# Patient Record
Sex: Female | Born: 1970 | Race: Black or African American | Hispanic: No | Marital: Married | Smoking: Never smoker
Health system: Southern US, Community
[De-identification: ages and names within clinical notes are randomized; demographics above are authoritative.]

## PROBLEM LIST (undated history)

## (undated) DIAGNOSIS — E119 Type 2 diabetes mellitus without complications: Secondary | ICD-10-CM

## (undated) DIAGNOSIS — I10 Essential (primary) hypertension: Secondary | ICD-10-CM

## (undated) HISTORY — PX: HERNIA REPAIR: SHX51

## (undated) HISTORY — PX: CHOLECYSTECTOMY: SHX55

## (undated) HISTORY — PX: THYROID SURGERY: SHX805

## (undated) HISTORY — PX: TUBAL LIGATION: SHX77

---

## 2015-12-26 ENCOUNTER — Encounter (HOSPITAL_BASED_OUTPATIENT_CLINIC_OR_DEPARTMENT_OTHER): Payer: Self-pay

## 2015-12-26 ENCOUNTER — Emergency Department (HOSPITAL_BASED_OUTPATIENT_CLINIC_OR_DEPARTMENT_OTHER)
Admission: EM | Admit: 2015-12-26 | Discharge: 2015-12-27 | Disposition: A | Discharge status: Home / Self Care | Payer: Worker's Compensation | Attending: Emergency Medicine | Admitting: Emergency Medicine

## 2015-12-26 ENCOUNTER — Emergency Department (HOSPITAL_BASED_OUTPATIENT_CLINIC_OR_DEPARTMENT_OTHER): Payer: Worker's Compensation

## 2015-12-26 DIAGNOSIS — Z79899 Other long term (current) drug therapy: Secondary | ICD-10-CM | POA: Diagnosis not present

## 2015-12-26 DIAGNOSIS — I10 Essential (primary) hypertension: Secondary | ICD-10-CM | POA: Diagnosis not present

## 2015-12-26 DIAGNOSIS — M545 Low back pain: Secondary | ICD-10-CM | POA: Diagnosis present

## 2015-12-26 DIAGNOSIS — E119 Type 2 diabetes mellitus without complications: Secondary | ICD-10-CM | POA: Insufficient documentation

## 2015-12-26 DIAGNOSIS — M5432 Sciatica, left side: Secondary | ICD-10-CM | POA: Diagnosis not present

## 2015-12-26 HISTORY — DX: Type 2 diabetes mellitus without complications: E11.9

## 2015-12-26 HISTORY — DX: Essential (primary) hypertension: I10

## 2015-12-26 LAB — PREGNANCY, URINE: Preg Test, Ur: NEGATIVE
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
## 2015-12-26 MED ORDER — KETOROLAC TROMETHAMINE 60 MG/2ML IM SOLN
60.0000 mg | Freq: Once | INTRAMUSCULAR | Status: DC
Start: 2015-12-26 — End: 2015-12-26

## 2015-12-26 MED ORDER — DEXAMETHASONE SODIUM PHOSPHATE 10 MG/ML IJ SOLN
10.0000 mg | Freq: Once | INTRAMUSCULAR | Status: AC
  Administered 2015-12-26: 10 mg via INTRAMUSCULAR
  Filled 2015-12-26: qty 1

## 2015-12-26 MED ORDER — IBUPROFEN 800 MG PO TABS
800.0000 mg | ORAL_TABLET | Freq: Once | ORAL | Status: AC
  Administered 2015-12-26: 800 mg via ORAL
  Filled 2015-12-26: qty 1

## 2015-12-26 MED ORDER — METHOCARBAMOL 500 MG PO TABS
1000.0000 mg | ORAL_TABLET | Freq: Once | ORAL | Status: AC
  Administered 2015-12-26: 1000 mg via ORAL
  Filled 2015-12-26: qty 2
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
## 2015-12-26 NOTE — ED Notes (Signed)
Pt placed on auto vitals Q30.  
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
## 2015-12-27 ENCOUNTER — Encounter (HOSPITAL_BASED_OUTPATIENT_CLINIC_OR_DEPARTMENT_OTHER): Payer: Self-pay | Admitting: Emergency Medicine

## 2015-12-27 MED ORDER — METHOCARBAMOL 500 MG PO TABS: 500.0000 mg | ORAL_TABLET | Freq: Two times a day (BID) | ORAL | Status: AC

## 2015-12-27 MED ORDER — MELOXICAM 15 MG PO TABS: 15.0000 mg | ORAL_TABLET | Freq: Every day | ORAL | Status: AC

## 2015-12-27 MED ORDER — PREDNISONE 20 MG PO TABS: ORAL_TABLET | ORAL | Status: AC

## 2015-12-27 NOTE — Discharge Instructions (Signed)

## 2015-12-27 NOTE — ED Notes (Signed)
Pt verbalizes understanding of d/c instructions and denies any further needs at this time. 

## 2021-02-01 ENCOUNTER — Emergency Department (HOSPITAL_BASED_OUTPATIENT_CLINIC_OR_DEPARTMENT_OTHER)
Admission: EM | Admit: 2021-02-01 | Discharge: 2021-02-01 | Disposition: A | Discharge status: Home / Self Care | Payer: BC Managed Care – PPO | Attending: Emergency Medicine | Admitting: Emergency Medicine

## 2021-02-01 ENCOUNTER — Encounter (HOSPITAL_BASED_OUTPATIENT_CLINIC_OR_DEPARTMENT_OTHER): Payer: Self-pay | Admitting: *Deleted

## 2021-02-01 ENCOUNTER — Emergency Department (HOSPITAL_BASED_OUTPATIENT_CLINIC_OR_DEPARTMENT_OTHER): Payer: BC Managed Care – PPO

## 2021-02-01 ENCOUNTER — Other Ambulatory Visit: Payer: Self-pay

## 2021-02-01 DIAGNOSIS — M545 Low back pain, unspecified: Secondary | ICD-10-CM | POA: Diagnosis not present

## 2021-02-01 DIAGNOSIS — E119 Type 2 diabetes mellitus without complications: Secondary | ICD-10-CM | POA: Insufficient documentation

## 2021-02-01 DIAGNOSIS — M25512 Pain in left shoulder: Secondary | ICD-10-CM | POA: Diagnosis not present

## 2021-02-01 DIAGNOSIS — I10 Essential (primary) hypertension: Secondary | ICD-10-CM | POA: Diagnosis not present

## 2021-02-01 DIAGNOSIS — Z79899 Other long term (current) drug therapy: Secondary | ICD-10-CM | POA: Insufficient documentation

## 2021-02-01 DIAGNOSIS — Z8616 Personal history of COVID-19: Secondary | ICD-10-CM | POA: Diagnosis not present
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
## 2021-02-07 ENCOUNTER — Ambulatory Visit: Payer: Self-pay

## 2021-02-07 ENCOUNTER — Encounter: Payer: Self-pay | Admitting: Family Medicine

## 2021-02-07 ENCOUNTER — Ambulatory Visit (INDEPENDENT_AMBULATORY_CARE_PROVIDER_SITE_OTHER): Payer: BC Managed Care – PPO | Admitting: Family Medicine

## 2021-02-07 ENCOUNTER — Other Ambulatory Visit: Payer: Self-pay

## 2021-02-07 VITALS — BP 170/106 | Ht 64.25 in | Wt 208.0 lb

## 2021-02-07 DIAGNOSIS — M25512 Pain in left shoulder: Secondary | ICD-10-CM

## 2021-02-07 DIAGNOSIS — M778 Other enthesopathies, not elsewhere classified: Secondary | ICD-10-CM | POA: Diagnosis not present

## 2021-02-07 NOTE — Patient Instructions (Signed)
Nice to meet you Please try ice  Please try the exercises  Please take 2 pills of rayos as close to 10 pm. Please take until it is finished. This is a steroid so it can raise your blood sugar.   Please send me a message in MyChart with any questions or updates.  Please see me back in 4 weeks.   --Dr. Jordan Likes
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
## 2021-02-07 NOTE — Progress Notes (Signed)
Medication Samples have been provided to the patient.  Drug name:    rayose    Strength:  5mg        Qty: 2 boxes  LOT:  Exp.Date: 7/22  Dosing instructions: take 2 at 10pm  The patient has been instructed regarding the correct time, dose, and frequency of taking this medication, including desired effects and most common side effects.   8/22 4:23 PM 02/07/2021

## 2021-02-08 DIAGNOSIS — M778 Other enthesopathies, not elsewhere classified: Secondary | ICD-10-CM | POA: Insufficient documentation
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
## 2021-03-01 ENCOUNTER — Telehealth: Payer: Self-pay | Admitting: Family Medicine

## 2021-03-01 NOTE — Telephone Encounter (Signed)
Patient called state forgot to get work restriction  note for 2nd job she works during summer only( ask if provider can write a note limiting weight / lbs she can lift .  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  Thanks  glh

## 2021-03-02 NOTE — Telephone Encounter (Signed)
I called pt- she requests letter be emailed to her and left upfront at our front desk for her to p/u next week.   Letter emailed to [email or web address]. Original upfront for p/u.

## 2021-03-02 NOTE — Telephone Encounter (Signed)
Provided work note.   Myra Rude, MD [facility] 03/02/2021, 1:53 PM

## 2021-03-06 ENCOUNTER — Other Ambulatory Visit: Payer: Self-pay

## 2021-03-06 ENCOUNTER — Encounter: Payer: Self-pay | Admitting: Family Medicine

## 2021-03-06 ENCOUNTER — Ambulatory Visit (INDEPENDENT_AMBULATORY_CARE_PROVIDER_SITE_OTHER): Payer: BC Managed Care – PPO | Admitting: Family Medicine

## 2021-03-06 VITALS — BP 148/90 | Ht 64.25 in | Wt 208.0 lb

## 2021-03-06 DIAGNOSIS — M778 Other enthesopathies, not elsewhere classified: Secondary | ICD-10-CM | POA: Diagnosis not present
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
## 2021-03-06 NOTE — Patient Instructions (Signed)
Good to see you Please try ice as needed  Please try physical therapy   Please send me a message in MyChart with any questions or updates.  Please see me back in 4 weeks.   --Dr. Jordan Likes

## 2021-03-15 ENCOUNTER — Ambulatory Visit: Payer: BC Managed Care – PPO | Admitting: Rehabilitative and Restorative Service Providers"

## 2021-03-16 ENCOUNTER — Other Ambulatory Visit: Payer: Self-pay

## 2021-03-16 ENCOUNTER — Encounter: Payer: Self-pay | Admitting: Physical Therapy

## 2021-03-16 ENCOUNTER — Ambulatory Visit (INDEPENDENT_AMBULATORY_CARE_PROVIDER_SITE_OTHER): Payer: BC Managed Care – PPO | Admitting: Physical Therapy

## 2021-03-16 DIAGNOSIS — M25512 Pain in left shoulder: Secondary | ICD-10-CM | POA: Diagnosis not present

## 2021-03-16 DIAGNOSIS — R29898 Other symptoms and signs involving the musculoskeletal system: Secondary | ICD-10-CM

## 2021-03-16 DIAGNOSIS — M6281 Muscle weakness (generalized): Secondary | ICD-10-CM
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
## 2021-03-16 NOTE — Patient Instructions (Signed)
Access Code: [email or web address] URL: [email or web address] Date: 03/16/2021 Prepared by: Reggy Eye  Exercises Supine Shoulder Flexion Extension AAROM with Dowel - 1 x daily - 7 x weekly - 2 sets - 10 reps Supine Shoulder Abduction AAROM with Dowel - 1 x daily - 7 x weekly - 2 sets - 10 reps Standing Bilateral Low Shoulder Row with Anchored Resistance - 1 x daily - 7 x weekly - 3 sets - 10 reps Shoulder extension with resistance - Neutral - 1 x daily - 7 x weekly - 3 sets - 10 reps Standing Shoulder Extension with Dowel - 1 x daily - 7 x weekly - 2 sets - 10 reps

## 2021-03-23 ENCOUNTER — Ambulatory Visit: Payer: BC Managed Care – PPO | Admitting: Physical Therapy

## 2021-03-23 ENCOUNTER — Other Ambulatory Visit: Payer: Self-pay

## 2021-03-23 DIAGNOSIS — R29898 Other symptoms and signs involving the musculoskeletal system: Secondary | ICD-10-CM

## 2021-03-23 DIAGNOSIS — M6281 Muscle weakness (generalized): Secondary | ICD-10-CM

## 2021-03-23 DIAGNOSIS — M25512 Pain in left shoulder: Secondary | ICD-10-CM | POA: Diagnosis not present

## 2021-03-23 NOTE — Patient Instructions (Signed)
Access Code: [email or web address] URL: [email or web address] Date: 03/23/2021 Prepared by: Reggy Eye  Exercises Supine Shoulder Flexion Extension AAROM with Dowel - 1 x daily - 7 x weekly - 2 sets - 10 reps Supine Shoulder Abduction AAROM with Dowel - 1 x daily - 7 x weekly - 2 sets - 10 reps Standing Bilateral Low Shoulder Row with Anchored Resistance - 1 x daily - 7 x weekly - 3 sets - 10 reps Shoulder extension with resistance - Neutral - 1 x daily - 7 x weekly - 3 sets - 10 reps Standing Shoulder Extension with Dowel - 1 x daily - 7 x weekly - 2 sets - 10 reps Shoulder External Rotation and Scapular Retraction with Resistance - 1 x daily - 7 x weekly - 3 sets - 10 reps Seated Shoulder Diagonal with Resistance - 1 x daily - 7 x weekly - 3 sets - 10 reps  Patient Education [place] Patient Instructions

## 2021-03-23 NOTE — Therapy (Signed)
[facility] [place] [address] [place], [place], [address] Phone: [phone]   Fax:  [phone]  Physical Therapy Treatment  Patient Details  Name: Morgan Frey MRN: [id number] Date of Birth: 06/07/1971 Referring Provider (PT): schmitz, jeremy   Encounter Date: 03/23/2021   PT End of Session - 03/23/21 1102     Visit Number 2    Number of Visits 6    Date for PT Re-Evaluation 04/27/21    PT Start Time 1020    PT Stop Time 1100    PT Time Calculation (min) 40 min    Activity Tolerance Patient tolerated treatment well    Behavior During Therapy [facility] for tasks assessed/performed             Past Medical History:  Diagnosis Date   Diabetes mellitus without complication (HCC)    Hypertension     Past Surgical History:  Procedure Laterality Date   CHOLECYSTECTOMY     HERNIA REPAIR     THYROID SURGERY     TUBAL LIGATION      There were no vitals filed for this visit.   Subjective Assessment - 03/23/21 1023     Subjective Pt states she is "used to the pain". She states she feels "ok"    Pertinent History rotator cuff repair Lt 3 yrs ago, Rt 1 year ago    Patient Stated Goals Decrease pain    Currently in Pain? No/denies                [facility] PT Assessment - 03/23/21 0001       Assessment   Medical Diagnosis capsulitis of left shoulder    Referring Provider (PT) schmitz, jeremy      AROM   Left Shoulder Extension 44 Degrees    Left Shoulder Flexion 130 Degrees    Left Shoulder ABduction 115 Degrees                           [facility] Adult PT Treatment/Exercise - 03/23/21 0001       Shoulder Exercises: Supine   Other Supine Exercises AAROM with cane flexion x 10      Shoulder Exercises: Seated   Diagonals 10 reps    Theraband Level (Shoulder Diagonals) Level 2 (Red)    Other Seated Exercises W red TB x 20      Shoulder Exercises: Standing   Extension 20 reps    Theraband  Level (Shoulder Extension) Level 3 (Green)    Row 20 reps    Theraband Level (Shoulder Row) Level 3 (Green)      Shoulder Exercises: ROM/Strengthening   UBE (Upper Arm Bike) level 1 4 min alt fwd/bkwd      Modalities   Modalities Iontophoresis      Iontophoresis   Type of Iontophoresis Dexamethasone    Location Lt shoulder    Dose 1 ml    Time 4 hour      Manual Therapy   Manual Therapy Soft tissue mobilization;Joint mobilization    Joint Mobilization GH grade 2-3 inferior glides and A/P glides    Soft tissue mobilization STM and TPR anterior deltoid, bicep, pecs on LT                    PT Education - 03/23/21 1101     Education Details ionto    Person(s) Educated Patient    Methods Explanation;Handout    Comprehension  Verbalized understanding                 PT Long Term Goals - 03/16/21 1016       PT LONG TERM GOAL #1   Title Pt will be independent with HEP    Time 6    Period Weeks    Status New    Target Date 04/27/21      PT LONG TERM GOAL #2   Title Pt will improve LT shoulder flexion and abduction ROM to 155 degrees to improve functional mobility    Time 6    Period Weeks    Status New    Target Date 04/27/21      PT LONG TERM GOAL #3   Title Pt will improve bilat shoulder strength to 4+/5 to be able to work in [employer] with decreased pain    Time 6    Period Weeks    Status New    Target Date 04/27/21      PT LONG TERM GOAL #4   Title Pt will be able to wash her back with Lt UE to demo improved functional motion    Time 6    Period Weeks    Status New    Target Date 04/27/21                   Plan - 03/23/21 1102     Clinical Impression Statement Pt with increased TTP anterior shoulder this visit, trial of ionto to decrease pain and inflammation. Pt with good tolerance to progression of strengthening exercises and manual therapy    PT Next Visit Plan progress shoulder strength and ROM, assess ionto    PT Home  Exercise Plan [id number]    Consulted and Agree with Plan of Care Patient             Patient will benefit from skilled therapeutic intervention in order to improve the following deficits and impairments:     Visit Diagnosis: Acute pain of left shoulder  Other symptoms and signs involving the musculoskeletal system  Muscle weakness (generalized)     Problem List Patient Active Problem List   Diagnosis Date Noted   Capsulitis of left shoulder 02/08/2021   Morgan Frey, PT  Morgan Frey 03/23/2021, 11:04 AM  [facility] [address] [place], [place], [address] Phone: [phone]   Fax:  [phone]  Name: Morgan Frey MRN: [id number] Date of Birth: 1971/08/16

## 2021-03-30 ENCOUNTER — Ambulatory Visit: Payer: BC Managed Care – PPO | Admitting: Rehabilitative and Restorative Service Providers"

## 2021-03-30 ENCOUNTER — Other Ambulatory Visit: Payer: Self-pay

## 2021-03-30 DIAGNOSIS — M6281 Muscle weakness (generalized): Secondary | ICD-10-CM

## 2021-03-30 DIAGNOSIS — R29898 Other symptoms and signs involving the musculoskeletal system: Secondary | ICD-10-CM | POA: Diagnosis not present

## 2021-03-30 DIAGNOSIS — M25512 Pain in left shoulder: Secondary | ICD-10-CM

## 2021-03-30 NOTE — Therapy (Signed)
[facility] [place] [address] [place], [place], [address] Phone: [phone]   Fax:  [phone]  Physical Therapy Treatment  Patient Details  Name: Morgan Frey MRN: [id number] Date of Birth: 04-21-1971 Referring Provider (PT): schmitz, jeremy   Encounter Date: 03/30/2021   PT End of Session - 03/30/21 1539     Visit Number 3    Number of Visits 6    Date for PT Re-Evaluation 04/27/21    PT Start Time 1535    PT Stop Time 1615    PT Time Calculation (min) 40 min    Activity Tolerance Patient tolerated treatment well    Behavior During Therapy [facility] for tasks assessed/performed             Past Medical History:  Diagnosis Date   Diabetes mellitus without complication (HCC)    Hypertension     Past Surgical History:  Procedure Laterality Date   CHOLECYSTECTOMY     HERNIA REPAIR     THYROID SURGERY     TUBAL LIGATION      There were no vitals filed for this visit.   Subjective Assessment - 03/30/21 1547     Subjective The patient has more ROM, but continues with pain.    Pertinent History rotator cuff repair Lt 3 yrs ago, Rt 1 year ago    Patient Stated Goals Decrease pain    Currently in Pain? Yes    Pain Score 4    at rest, 7-8/10 with movement during session   Pain Location Shoulder    Pain Orientation Left                [facility] PT Assessment - 03/30/21 1541       Assessment   Medical Diagnosis capsulitis of left shoulder    Referring Provider (PT) schmitz, jeremy      AROM   Left Shoulder Flexion 155 Degrees    Left Shoulder ABduction 140 Degrees    Left Shoulder External Rotation 55 Degrees                           [facility] Adult PT Treatment/Exercise - 03/30/21 1544       Exercises   Exercises Shoulder      Shoulder Exercises: Supine   Other Supine Exercises AROM supine flexion    Other Supine Exercises thoracic extension over towel roll      Shoulder  Exercises: Sidelying   Internal Rotation AROM;PROM    Internal Rotation Limitations overpressure to tolerance for anterior shoulder stretch    Other Sidelying Exercises open book within tolerable ROM for horiozontal abduction      Shoulder Exercises: Standing   External Rotation Strengthening;Both;10 reps    External Rotation Limitations Ls with cues for scap retraction    Flexion AROM;Left;5 reps    ABduction AROM;Left;10 reps    Retraction Strengthening;Both;10 reps    Retraction Limitations with pool noodle      Shoulder Exercises: ROM/Strengthening   UBE (Upper Arm Bike) L1 x 4 minut alt fwd/bkwd    Other ROM/Strengthening Exercises pendulum in between exercises to relax      Shoulder Exercises: Stretch   Corner Stretch Limitations door frame    Wall Stretch - ABduction 2 reps;30 seconds    Wall Stretch - ABduction Limitations cues to depress shoulder blade      Manual Therapy   Manual Therapy Soft tissue mobilization;Joint mobilization  Manual therapy comments self mobilization at door frame for STM mobility at home    Joint Mobilization [facility] joint distraction grade II    Soft tissue mobilization STM anterior deltoid, biceps, pectoralis, latissimus dorsi                         PT Long Term Goals - 03/30/21 1641       PT LONG TERM GOAL #1   Title Pt will be independent with HEP    Time 6    Period Weeks    Status On-going      PT LONG TERM GOAL #2   Title Pt will improve LT shoulder flexion and abduction ROM to 155 degrees to improve functional mobility    Time 6    Period Weeks    Status On-going      PT LONG TERM GOAL #3   Title Pt will improve bilat shoulder strength to 4+/5 to be able to work in [employer] with decreased pain    Time 6    Period Weeks    Status On-going      PT LONG TERM GOAL #4   Title Pt will be able to wash her back with Lt UE to demo improved functional motion    Time 6    Period Weeks    Status On-going                    Plan - 03/30/21 1617     Clinical Impression Statement The patient is improving ROM each treatment session.  She continues with soft tissue restrictions in L anterior shoulder, periscapular musculature.  PT continuing to progress to LTGs.    PT Treatment/Interventions Iontophoresis 4mg /ml Dexamethasone;Cryotherapy;Moist Heat;Electrical Stimulation;Neuromuscular re-education;Therapeutic exercise;Therapeutic activities;Patient/family education;Manual techniques;Taping;Dry needling;Passive range of motion;Vasopneumatic Device    PT Next Visit Plan STM to reduce muscle tightness/guarding,  ? add ionto (did last visit and may have helped), check ROM, strengthening-- send MD note for 8/15    PT Home Exercise Plan 9/15    Consulted and Agree with Plan of Care Patient             Patient will benefit from skilled therapeutic intervention in order to improve the following deficits and impairments:     Visit Diagnosis: Acute pain of left shoulder  Other symptoms and signs involving the musculoskeletal system  Muscle weakness (generalized)     Problem List Patient Active Problem List   Diagnosis Date Noted   Capsulitis of left shoulder 02/08/2021   02/10/2021, MPT  [name] 03/30/2021, 4:44 PM  [facility] [address], [place], [place] Phone: [phone]   Fax:  [phone]  Name: Morgan Frey MRN: [id number] Date of Birth: 29-Nov-1970

## 2021-03-30 NOTE — Patient Instructions (Signed)
Access Code: [email or web address] URL: [email or web address] Date: 03/30/2021 Prepared by: Margretta Ditty  Exercises Supine Shoulder Flexion Extension AAROM with Dowel - 1 x daily - 7 x weekly - 2 sets - 10 reps Supine Shoulder Abduction AAROM with Dowel - 1 x daily - 7 x weekly - 2 sets - 10 reps Standing Bilateral Low Shoulder Row with Anchored Resistance - 1 x daily - 7 x weekly - 3 sets - 10 reps Shoulder extension with resistance - Neutral - 1 x daily - 7 x weekly - 3 sets - 10 reps Standing Shoulder Extension with Dowel - 1 x daily - 7 x weekly - 2 sets - 10 reps Shoulder External Rotation and Scapular Retraction with Resistance - 1 x daily - 7 x weekly - 3 sets - 10 reps Seated Shoulder Diagonal with Resistance - 1 x daily - 7 x weekly - 3 sets - 10 reps Standing Pectoral Release with Ball at Wall - 2 x daily - 7 x weekly - 1 sets - 1 reps - 2 minutes hold Standing Bicep Stretch at Wall - 2 x daily - 7 x weekly - 1 sets - 2 reps - 30 seconds hold

## 2021-04-05 ENCOUNTER — Ambulatory Visit: Payer: BC Managed Care – PPO | Admitting: Physical Therapy

## 2021-04-05 ENCOUNTER — Other Ambulatory Visit: Payer: Self-pay

## 2021-04-05 DIAGNOSIS — M25512 Pain in left shoulder: Secondary | ICD-10-CM

## 2021-04-05 DIAGNOSIS — R29898 Other symptoms and signs involving the musculoskeletal system: Secondary | ICD-10-CM

## 2021-04-05 DIAGNOSIS — M6281 Muscle weakness (generalized): Secondary | ICD-10-CM

## 2021-04-05 NOTE — Therapy (Signed)
[place], [place], [address] Phone: [phone]   Fax:  [phone]  Physical Therapy Treatment  Patient Details  Name: Morgan Frey MRN: [id number] Date of Birth: 11-14-1970 Referring Provider (PT): Clearance Coots   Encounter Date: 04/05/2021   PT End of Session - 04/05/21 1707     Visit Number 4    Number of Visits 6    Date for PT Re-Evaluation 04/27/21    PT Start Time 1707    PT Stop Time 1749    PT Time Calculation (min) 42 min    Activity Tolerance Patient tolerated treatment well    Behavior During Therapy [facility] for tasks assessed/performed             Past Medical History:  Diagnosis Date   Diabetes mellitus without complication ([place])    Hypertension     Past Surgical History:  Procedure Laterality Date   CHOLECYSTECTOMY     HERNIA REPAIR     THYROID SURGERY     TUBAL LIGATION      There were no vitals filed for this visit.   Subjective Assessment - 04/05/21 1712     Subjective Pt reports she does some of her HEP exercises at least 1x/day.  She reports the exercises interrupts the pain that she's feeling. She's noticed she can reach her Lt hand behind back a little easier.    Pertinent History rotator cuff repair Lt 3 yrs ago, Rt 1 year ago    Currently in Pain? Yes    Pain Score 2     Pain Location Shoulder    Pain Orientation Left    Pain Descriptors / Indicators Tightness    Aggravating Factors  heavy lifting, carrying purse on Lt side    Pain Relieving Factors massage to areas                [facility] PT Assessment - 04/05/21 0001       Assessment   Medical Diagnosis capsulitis of left shoulder    Referring Provider (PT) Clearance Coots      AROM   Left Shoulder Flexion 155 Degrees    Left Shoulder ABduction 146 Degrees    Left Shoulder External Rotation 67 Degrees   standing shoulder abdct ~80 deg.     Strength   Right Shoulder Flexion --     Right Shoulder ABduction --    Left Shoulder Flexion 3+/5   with pain   Left Shoulder Extension 5/5    Left Shoulder ABduction 4/5   with pain   Left Shoulder Internal Rotation 4/5    Left Shoulder External Rotation 3+/5              [facility] Adult PT Treatment/Exercise - 04/05/21 0001       Shoulder Exercises: Supine   External Rotation AAROM;Left;10 reps   cane, cues for form; supported scaption     Shoulder Exercises: Seated   External Rotation AROM;Both;5 reps   L's with scap squeeze   External Rotation Limitations reviewing form for HEP with band      Shoulder Exercises: ROM/Strengthening   UBE (Upper Arm Bike) L2: 1.5 min forward/ 1.5 min backward      Shoulder Exercises: Stretch   Internal Rotation Stretch 3 reps   RUE assisting LT   Wall Stretch - Flexion 2 reps    Table Stretch - Flexion --   demo provided as alternative   English as a second language teacher 3  reps;10 seconds   back of palms to forehead.   Other Shoulder Stretches midlevel doorway stretch x 15 sec x 3 reps, bicep stretch (bilat) with hands on door frame x 15 sec each.  Lt pec stretch with arm abdct on wall, elbow straight and turning feet away from arm x 30 sec    Other Shoulder Stretches seated thoracic ext over back of chair with hands behind head x 10 sec (struggle to get LUE in position to support head). Lt upper trap and levator stretch x 15 sec each.   shoulder rolls x 5      Iontophoresis   Type of Iontophoresis Dexamethasone    Location Lt ant shoulder (bicep brachii tendon)    Dose 1 cc    Time 80 mA stat patch, 4 hr wear time.                    PT Education - 04/05/21 1757     Education Details HEP updated.    Person(s) Educated Patient    Methods Explanation;Handout    Comprehension Verbalized understanding;Returned demonstration;Verbal cues required                 PT Long Term Goals - 04/05/21 1755       PT LONG TERM GOAL #1   Title Pt will be independent with HEP    Time 6     Period Weeks    Status On-going      PT LONG TERM GOAL #2   Title Pt will improve LT shoulder flexion and abduction ROM to 155 degrees to improve functional mobility    Time 6    Period Weeks    Status Partially Met      PT LONG TERM GOAL #3   Title Pt will improve bilat shoulder strength to 4+/5 to be able to work in [employer] with decreased pain    Time 6    Period Weeks    Status On-going      PT LONG TERM GOAL #4   Title Pt will be able to wash her back with Lt UE to demo improved functional motion    Baseline improved, but not met yet.    Time 6    Period Weeks    Status On-going                   Plan - 04/05/21 1712     Clinical Impression Statement Pt making gradual gains with Lt shoulder strength and ROM.  Continues with point tenderness in prox Lt bicep brachii tendon and Lt upper trap.  She required minor cues for form with exercises today.  HEP updated. Progressing towards goals.    Rehab Potential Good    PT Frequency 1x / week    PT Duration 6 weeks    PT Treatment/Interventions Iontophoresis 73m/ml Dexamethasone;Cryotherapy;Moist Heat;Electrical Stimulation;Neuromuscular re-education;Therapeutic exercise;Therapeutic activities;Patient/family education;Manual techniques;Taping;Dry needling;Passive range of motion;Vasopneumatic Device    PT Next Visit Plan continue Lt shoulder ROM/ functional strengthening.  ionto. DN/manual therapy as indicated.    PT Home Exercise Plan [id number]   Consulted and Agree with Plan of Care Patient             Patient will benefit from skilled therapeutic intervention in order to improve the following deficits and impairments:  Pain, Impaired UE functional use, Impaired flexibility, Decreased strength, Decreased activity tolerance, Decreased range of motion  Visit Diagnosis: Acute pain of left shoulder  Other  symptoms and signs involving the musculoskeletal system  Muscle weakness (generalized)     Problem  List Patient Active Problem List   Diagnosis Date Noted   Capsulitis of left shoulder 02/08/2021    Kerin Perna, PTA 04/05/21 6:07 PM  [place], [place], [address] Phone: [phone]   Fax:  [phone]  Name: Deeya Richeson MRN: [id number] Date of Birth: 1971-01-10

## 2021-04-05 NOTE — Patient Instructions (Signed)
Access Code: [email or web address] URL: [email or web address] Date: 04/05/2021 Prepared by: [facility] [place]  Exercises Supine Shoulder Flexion Extension AAROM with Dowel - 1 x daily - 7 x weekly - 2 sets - 10 reps Supine Shoulder Abduction AAROM with Dowel - 1 x daily - 7 x weekly - 2 sets - 10 reps Standing Bilateral Low Shoulder Row with Anchored Resistance - 1 x daily - 7 x weekly - 3 sets - 10 reps Shoulder extension with resistance - Neutral - 1 x daily - 7 x weekly - 3 sets - 10 reps Standing Shoulder Extension with Dowel - 1 x daily - 7 x weekly - 2 sets - 10 reps Shoulder External Rotation and Scapular Retraction with Resistance - 1 x daily - 7 x weekly - 3 sets - 10 reps Seated Shoulder Diagonal with Resistance - 1 x daily - 7 x weekly - 3 sets - 10 reps Standing Pectoral Release with Ball at Wall - 2 x daily - 7 x weekly - 1 sets - 1 reps - 2 minutes hold Standing Bicep Stretch at Wall - 2 x daily - 7 x weekly - 1 sets - 2 reps - 15 seconds hold Standing Shoulder Flexion Stretch on Wall - 1 x daily - 7 x weekly - 1 sets - 3-5 reps Supine Shoulder External Rotation in 45 Degrees Abduction AAROM with Dowel - 1 x daily - 7 x weekly - 1 sets - 5 reps - 10 hold Supine Chest Stretch with Elbows Bent - 1 x daily - 7 x weekly - 1 sets - 2-3 reps - 15 seconds hold

## 2021-04-09 ENCOUNTER — Ambulatory Visit (INDEPENDENT_AMBULATORY_CARE_PROVIDER_SITE_OTHER): Payer: BC Managed Care – PPO | Admitting: Family Medicine

## 2021-04-09 ENCOUNTER — Other Ambulatory Visit: Payer: Self-pay

## 2021-04-09 DIAGNOSIS — M778 Other enthesopathies, not elsewhere classified: Secondary | ICD-10-CM | POA: Diagnosis not present
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
## 2021-04-11 ENCOUNTER — Encounter: Payer: Self-pay | Admitting: Rehabilitative and Restorative Service Providers"

## 2021-04-11 ENCOUNTER — Other Ambulatory Visit: Payer: Self-pay

## 2021-04-11 ENCOUNTER — Ambulatory Visit: Payer: BC Managed Care – PPO | Admitting: Rehabilitative and Restorative Service Providers"

## 2021-04-11 DIAGNOSIS — R29898 Other symptoms and signs involving the musculoskeletal system: Secondary | ICD-10-CM

## 2021-04-11 DIAGNOSIS — M6281 Muscle weakness (generalized): Secondary | ICD-10-CM

## 2021-04-11 DIAGNOSIS — M25512 Pain in left shoulder: Secondary | ICD-10-CM | POA: Diagnosis not present

## 2021-04-11 NOTE — Therapy (Signed)
[place], [place], [address] Phone: [phone]   Fax:  [phone]  Physical Therapy Treatment  Patient Details  Name: Morgan Frey MRN: [id number] Date of Birth: [birth year] Referring Provider (PT): Clearance Coots   Encounter Date: 04/11/2021   PT End of Session - 04/11/21 1600     Visit Number 5    Number of Visits 6    Date for PT Re-Evaluation 04/27/21    PT Start Time 1600    PT Stop Time 1650    PT Time Calculation (min) 50 min    Activity Tolerance Patient tolerated treatment well             Past Medical History:  Diagnosis Date   Diabetes mellitus without complication ([place])    Hypertension     Past Surgical History:  Procedure Laterality Date   CHOLECYSTECTOMY     HERNIA REPAIR     THYROID SURGERY     TUBAL LIGATION      There were no vitals filed for this visit.   Subjective Assessment - 04/11/21 1601     Subjective Patient reports that her shoulder is feeling "pretty good". She noticed some pain today when she was reaching but it didn't last long. Shoulder is not as tight as it was last week and it does not hurt as much as it did last week. Sleeping OK - knows how to prop pillows    Currently in Pain? No/denies    Pain Score 0-No pain    Pain Location Shoulder    Pain Orientation Left                               [facility] Adult PT Treatment/Exercise - 04/11/21 0001       Shoulder Exercises: Standing   Extension Strengthening;Both;10 reps;Theraband    Theraband Level (Shoulder Extension) Level 3 (Green)    Row Strengthening;Both;10 reps;Theraband    Theraband Level (Shoulder Row) Level 3 (Green)    Row Limitations bow and arrow green TB x 10 reps each side    Retraction Strengthening;Both;10 reps   10 sec hold   Retraction Limitations with pool noodle      Shoulder Exercises: Therapy Ball   Flexion Both;5 reps   15-20 sec hold    Flexion Limitations stepping under swiss ball to work on shoulder flexion      Shoulder Exercises: ROM/Strengthening   UBE (Upper Arm Bike) L4 x 4 min 2 fwd/2 back    Pendulum pendulum between exercises to relax tissues      Shoulder Exercises: Stretch   Other Shoulder Stretches midlevel doorway stretch x 20 sec x 2 reps, bicep stretch (bilat) with hands on door frame x 10-15 sec x 3 reps      Iontophoresis   Type of Iontophoresis Dexamethasone    Location Lt ant shoulder (bicep brachii tendon)    Dose 1 cc    Time 80 mA stat patch, 4 hr wear time.      Manual Therapy   Manual therapy comments pt supine    Joint Mobilization Inferior and posterior mobs Grade II/III    Soft tissue mobilization anterior chest/pecs; anterior deltoid, biceps,teres/lats Rt shoulder    Myofascial Release anterior chest/pecs    Passive ROM Lt shoulder flexion; scaption; ER in scapular plane    Manual Traction traction through long arm  PT Long Term Goals - 04/05/21 1755       PT LONG TERM GOAL #1   Title Pt will be independent with HEP    Time 6    Period Weeks    Status On-going      PT LONG TERM GOAL #2   Title Pt will improve LT shoulder flexion and abduction ROM to 155 degrees to improve functional mobility    Time 6    Period Weeks    Status Partially Met      PT LONG TERM GOAL #3   Title Pt will improve bilat shoulder strength to 4+/5 to be able to work in [employer] with decreased pain    Time 6    Period Weeks    Status On-going      PT LONG TERM GOAL #4   Title Pt will be able to wash her back with Lt UE to demo improved functional motion    Baseline improved, but not met yet.    Time 6    Period Weeks    Status On-going                   Plan - 04/11/21 1606     Clinical Impression Statement Continued improvement in Lt shoulder pain and improved function. Working on ROM and strength. PROM and stretch tolerated.    Rehab  Potential Good    PT Frequency 1x / week    PT Duration 6 weeks    PT Treatment/Interventions Iontophoresis 26m/ml Dexamethasone;Cryotherapy;Moist Heat;Electrical Stimulation;Neuromuscular re-education;Therapeutic exercise;Therapeutic activities;Patient/family education;Manual techniques;Taping;Dry needling;Passive range of motion;Vasopneumatic Device    PT Next Visit Plan continue Lt shoulder ROM/ functional strengthening.  ionto. DN/manual therapy as indicated.    PT Home Exercise Plan [id number]    [id number] Agree with Plan of Care Patient             Patient will benefit from skilled therapeutic intervention in order to improve the following deficits and impairments:     Visit Diagnosis: Acute pain of left shoulder  Other symptoms and signs involving the musculoskeletal system  Muscle weakness (generalized)     Problem List Patient Active Problem List   Diagnosis Date Noted   Capsulitis of left shoulder 02/08/2021    Ruari Mudgett PNilda SimmerPT, MPH  04/11/2021, 4:58 PM  [facility] [address]Phone: [phone]  Fax:  [phone] Name: Morgan DowMRN: [id number] of Birth: [birth year]

## 2021-04-19 ENCOUNTER — Encounter: Payer: Self-pay | Admitting: Rehabilitative and Restorative Service Providers"

## 2021-04-19 ENCOUNTER — Ambulatory Visit: Payer: BC Managed Care – PPO | Admitting: Rehabilitative and Restorative Service Providers"

## 2021-04-19 ENCOUNTER — Other Ambulatory Visit: Payer: Self-pay

## 2021-04-19 DIAGNOSIS — M6281 Muscle weakness (generalized): Secondary | ICD-10-CM

## 2021-04-19 DIAGNOSIS — R29898 Other symptoms and signs involving the musculoskeletal system: Secondary | ICD-10-CM | POA: Diagnosis not present

## 2021-04-19 DIAGNOSIS — M25512 Pain in left shoulder: Secondary | ICD-10-CM | POA: Diagnosis not present

## 2021-04-19 NOTE — Patient Instructions (Signed)

## 2021-04-19 NOTE — Therapy (Addendum)
[place], [place], [address] Phone: [phone]   Fax:  [phone]  Physical Therapy Treatment and Discharge Summary PHYSICAL THERAPY DISCHARGE SUMMARY  Visits from Start of Care: 6  Current functional level related to goals / functional outcomes: See progress note for discharge status    Remaining deficits: No known deficits    Education / Equipment: HEP    Patient agrees to discharge. Patient goals were met. Patient is being discharged due to meeting the stated rehab goals.  Morgan Frey P. Helene Kelp PT, MPH 05/23/21 1:01 PM   Patient Details  Name: Morgan Frey MRN: [id number] Date of Birth: [birth year] Referring Provider (PT): Clearance Coots   Encounter Date: 04/19/2021   PT End of Session - 04/19/21 1659     Visit Number 6    Number of Visits 6    Date for PT Re-Evaluation 04/27/21    PT Start Time 1497    PT Stop Time 0263    PT Time Calculation (min) 51 min    Activity Tolerance Patient tolerated treatment well             Past Medical History:  Diagnosis Date   Diabetes mellitus without complication ([place])    Hypertension     Past Surgical History:  Procedure Laterality Date   CHOLECYSTECTOMY     HERNIA REPAIR     THYROID SURGERY     TUBAL LIGATION      There were no vitals filed for this visit.   Subjective Assessment - 04/19/21 1700     Subjective Shoulder is doing pretty well. She is working on her exercises at home. Changed positions last night to help with sleep. Can't believe the difference in how shoulder feels after DN.    Currently in Pain? No/denies    Pain Score 0-No pain    Pain Location Shoulder    Pain Orientation Left    Pain Descriptors / Indicators Tightness                [facility] PT Assessment - 04/19/21 0001       Assessment   Medical Diagnosis capsulitis of left shoulder    Referring Provider (PT) Clearance Coots       Posture/Postural Control   Posture Comments head forward shoulders rounded      AROM   Overall AROM Comments discomfort at end ranges    Left Shoulder Flexion 155 Degrees   tight compared to Lt   Left Shoulder ABduction 168 Degrees    Left Shoulder Internal Rotation --   thumb to T8/9   Left Shoulder External Rotation 90 Degrees   shoulder 90 deg abd elbow 90 deg flexion     Strength   Left Shoulder Flexion 4+/5    Left Shoulder Extension 5/5    Left Shoulder ABduction 5/5    Left Shoulder Internal Rotation 5/5    Left Shoulder External Rotation 5/5      Palpation   Palpation comment tight Lt anterio rshoulder pecs; deltoid; biceps                           [facility] Adult PT Treatment/Exercise - 04/19/21 0001       Shoulder Exercises: ROM/Strengthening   UBE (Upper Arm Bike) L4 x 4 min 2 fwd/2 back      Shoulder Exercises: Stretch   Other Shoulder Stretches midlevel doorway stretch x 20 sec x 2  reps, bicep stretch (bilat) with hands on door frame x 10-15 sec x 3 reps      Moist Heat Therapy   Number Minutes Moist Heat 10 Minutes    Moist Heat Location Shoulder   Lt     Manual Therapy   Manual therapy comments skilled palpation to assess response to DN and manual work    Soft tissue mobilization anterior chest/pecs; anterior deltoid, biceps,teres/lats Rt shoulder    Myofascial Release anterior chest/pecs    Passive ROM Lt shoulder flexion; scaption; ER in scapular plane    Manual Traction traction through long arm              Trigger Point Dry Needling - 04/19/21 0001     Consent Given? Yes    Education Handout Provided Yes    Muscles Treated Upper Quadrant Pectoralis major;Pectoralis minor;Deltoid;Biceps    Other Dry Needling Lt    Pectoralis Major Response Palpable increased muscle length;Twitch response elicited    Pectoralis Minor Response Palpable increased muscle length;Twitch response elicited    Deltoid Response Palpable increased muscle  length    Biceps Response Palpable increased muscle length                  PT Education - 04/19/21 1744     Education Details DN    Person(s) Educated Patient    Methods Explanation;Handout    Comprehension Verbalized understanding                 PT Long Term Goals - 04/19/21 1715       PT LONG TERM GOAL #1   Title Pt will be independent with HEP    Time 6    Period Weeks    Status Achieved      PT LONG TERM GOAL #2   Title Pt will improve LT shoulder flexion and abduction ROM to 155 degrees to improve functional mobility    Time 6    Period Weeks    Status Achieved      PT LONG TERM GOAL #3   Title Pt will improve bilat shoulder strength to 4+/5 to be able to work in [employer] with decreased pain    Time 6    Period Weeks    Status Achieved      PT LONG TERM GOAL #4   Title Pt will be able to wash her back with Lt UE to demo improved functional motion    Baseline -    Time 6    Period Weeks    Status Achieved                   Plan - 04/19/21 1719     Clinical Impression Statement Progressing well toward stated goals of therapy with initial goals of therpay accomplished. Patient has continued pain with AROM and resistive testing. She has good gains in ROM and strength Lt shoulder. She continues to have significant muscular tightness to palpation in the pecs/anterior shoulder. Good response to DN and manual work with decreased palpable tightness anterior shoulder. Patient will continue with independent HEP and call to schedule additional appointments as needed.    Rehab Potential Good    PT Frequency 1x / week    PT Duration 6 weeks    PT Treatment/Interventions Iontophoresis 48m/ml Dexamethasone;Cryotherapy;Moist Heat;Electrical Stimulation;Neuromuscular re-education;Therapeutic exercise;Therapeutic activities;Patient/family education;Manual techniques;Taping;Dry needling;Passive range of motion;Vasopneumatic Device    PT Next Visit  Plan patient will call to schedule additional appointments as  needed and we will recertify for additional visit approval by MD - - assess response to DN and manual work; continue with posterior shoulder girdle stabilization; Lt UE strengthening; additonal DN/manual work as indicated    PT [place] [id number]    [id number] and Agree with Plan of Care Patient             Patient will benefit from skilled therapeutic intervention in order to improve the following deficits and impairments:     Visit Diagnosis: Acute pain of left shoulder  Other symptoms and signs involving the musculoskeletal system  Muscle weakness (generalized)     Problem List Patient Active Problem List   Diagnosis Date Noted   Capsulitis of left shoulder 02/08/2021    Morgan Frey Nilda Simmer PT,MPH 04/19/2021, 5:57 PM  [facility] [place], [place], [address] Phone: [phone]   Fax:  [phone]  Name: Morgan Frey MRN: [id number] Date of Birth: May 11, 1971

## 2021-05-21 ENCOUNTER — Ambulatory Visit: Payer: BC Managed Care – PPO | Admitting: Family Medicine

## 2021-05-23 ENCOUNTER — Ambulatory Visit: Payer: BC Managed Care – PPO | Admitting: Family Medicine

## 2021-05-23 NOTE — Progress Notes (Deleted)
  Morgan Frey - 50 y.o. female MRN [id number]  Date of birth: 10-03-1970  SUBJECTIVE:  Including CC & ROS.  No chief complaint on file.   Morgan Frey is a 50 y.o. female that is  ***.  ***   Review of Systems See HPI   HISTORY: Past Medical, Surgical, Social, and Family History Reviewed & Updated per EMR.   Pertinent Historical Findings include:  Past Medical History:  Diagnosis Date   Diabetes mellitus without complication (H[facility])    Hypertension     Past Surgical History:  Procedure Laterality Date   CHOLECYSTECTOMY     HERNIA REPAIR     THYROID SURGERY     TUBAL LIGATION      No family history on file.  Social History   Socioeconomic History   Marital status: Married    Spouse name: Not on file   Number of children: Not on file   Years of education: Not on file   Highest education level: Not on file  Occupational History   Not on file  Tobacco Use   Smoking status: Never   Smokeless tobacco: Not on file  Substance and Sexual Activity   Alcohol use: No   Drug use: Not on file   Sexual activity: Yes    Birth control/protection: I.U.D.  Other Topics Concern   Not on file  Social History Narrative   Not on file   Social Determinants of Health   Financial Resource Strain: Not on file  Food Insecurity: Not on file  Transportation Needs: Not on file  Physical Activity: Not on file  Stress: Not on file  Social Connections: Not on file  Intimate Partner Violence: Not on file     PHYSICAL EXAM:  VS: There were no vitals taken for this visit. Physical Exam Gen: NAD, alert, cooperative with exam, well-appearing MSK:  ***      ASSESSMENT & PLAN:   No problem-specific Assessment & Plan notes found for this encounter.

## 2021-06-11 ENCOUNTER — Ambulatory Visit (INDEPENDENT_AMBULATORY_CARE_PROVIDER_SITE_OTHER): Payer: BC Managed Care – PPO | Admitting: Family Medicine

## 2021-06-11 ENCOUNTER — Encounter: Payer: Self-pay | Admitting: Family Medicine

## 2021-06-11 DIAGNOSIS — M778 Other enthesopathies, not elsewhere classified: Secondary | ICD-10-CM | POA: Diagnosis not present

## 2021-06-11 NOTE — Progress Notes (Signed)
  Morgan Frey - 50 y.o. female MRN [id number]  Date of birth: [birth year]  SUBJECTIVE:  Including CC & ROS.  No chief complaint on file.   Morgan Frey is a 50 y.o. female that is following up for her left shoulder pain.  She has been doing well with intermittent pain that is mild.  Has done well with physical therapy.   Review of Systems See HPI   HISTORY: Past Medical, Surgical, Social, and Family History Reviewed & Updated per EMR.   Pertinent Historical Findings include:  Past Medical History:  Diagnosis Date   Diabetes mellitus without complication (HCC)    Hypertension     Past Surgical History:  Procedure Laterality Date   CHOLECYSTECTOMY     HERNIA REPAIR     THYROID SURGERY     TUBAL LIGATION      History reviewed. No pertinent family history.  Social History   Socioeconomic History   Marital status: Married    Spouse name: Not on file   Number of children: Not on file   Years of education: Not on file   Highest education level: Not on file  Occupational History   Not on file  Tobacco Use   Smoking status: Never   Smokeless tobacco: Not on file  Substance and Sexual Activity   Alcohol use: No   Drug use: Not on file   Sexual activity: Yes    Birth control/protection: I.U.D.  Other Topics Concern   Not on file  Social History Narrative   Not on file   Social Determinants of Health   Financial Resource Strain: Not on file  Food Insecurity: Not on file  Transportation Needs: Not on file  Physical Activity: Not on file  Stress: Not on file  Social Connections: Not on file  Intimate Partner Violence: Not on file     PHYSICAL EXAM:  VS: Ht 5' 4.25" (1.632 m)   Wt 210 lb (95.3 kg)   BMI 35.77 kg/m  Physical Exam Gen: NAD, alert, cooperative with exam, well-appearing      ASSESSMENT & PLAN:   Capsulitis of left shoulder Has been doing well with little to no pain. -Counseled on home exercise therapy and supportive care. -Could  consider injection or MRI.

## 2021-06-11 NOTE — Assessment & Plan Note (Signed)
Has been doing well with little to no pain. -Counseled on home exercise therapy and supportive care. -Could consider injection or MRI.

## 2021-07-16 ENCOUNTER — Ambulatory Visit (INDEPENDENT_AMBULATORY_CARE_PROVIDER_SITE_OTHER): Payer: BC Managed Care – PPO | Admitting: Family Medicine

## 2021-07-16 ENCOUNTER — Encounter: Payer: Self-pay | Admitting: Family Medicine

## 2021-07-16 ENCOUNTER — Other Ambulatory Visit: Payer: Self-pay

## 2021-07-16 ENCOUNTER — Ambulatory Visit (HOSPITAL_BASED_OUTPATIENT_CLINIC_OR_DEPARTMENT_OTHER)
Admission: RE | Admit: 2021-07-16 | Discharge: 2021-07-16 | Disposition: A | Discharge status: Home / Self Care | Payer: BC Managed Care – PPO | Source: Ambulatory Visit | Attending: Family Medicine | Admitting: Family Medicine

## 2021-07-16 ENCOUNTER — Telehealth: Payer: Self-pay | Admitting: Family Medicine

## 2021-07-16 DIAGNOSIS — M778 Other enthesopathies, not elsewhere classified: Secondary | ICD-10-CM

## 2021-07-16 DIAGNOSIS — M7581 Other shoulder lesions, right shoulder: Secondary | ICD-10-CM | POA: Insufficient documentation

## 2021-07-16 IMAGING — DX DG SHOULDER 2+V*R*
3 series · 3 of 3 positions shown · non-contrast
Comparison: [DATE]

CLINICAL DATA: Chronic right shoulder pain.

EXAM:
RIGHT SHOULDER - 2+ VIEW

[shoulder grashey]
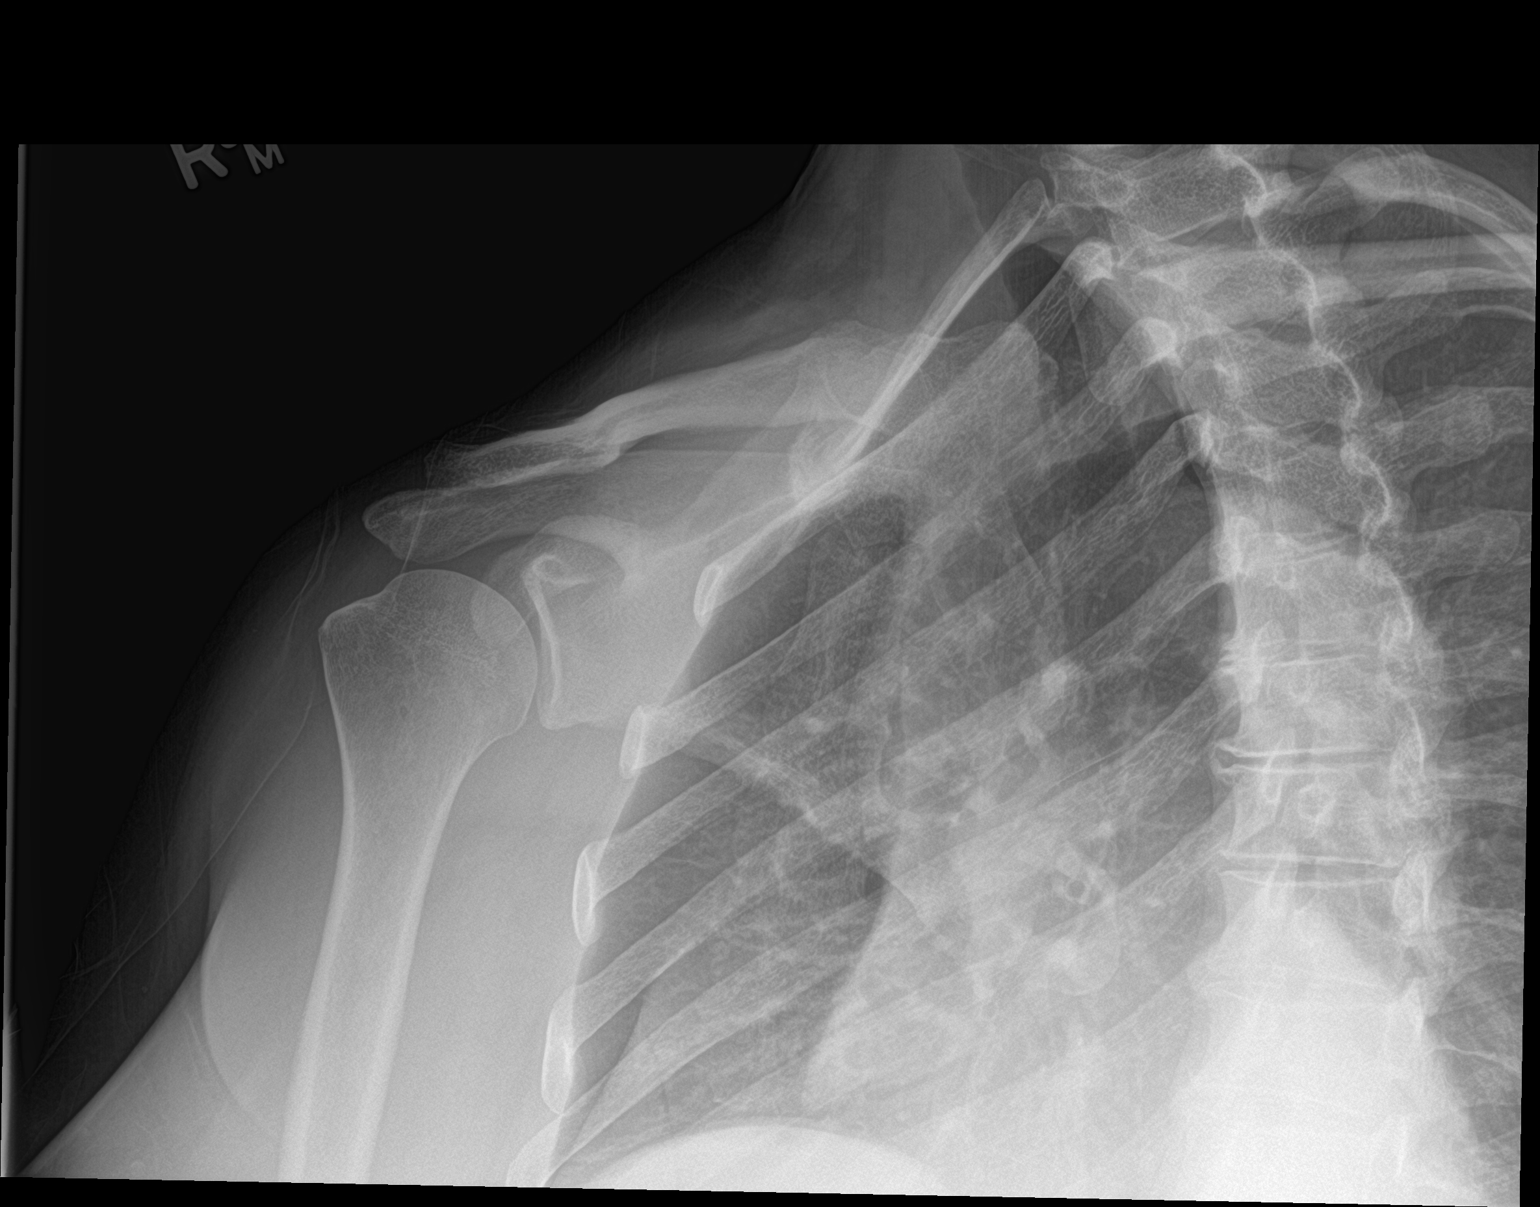

[shoulder y view]
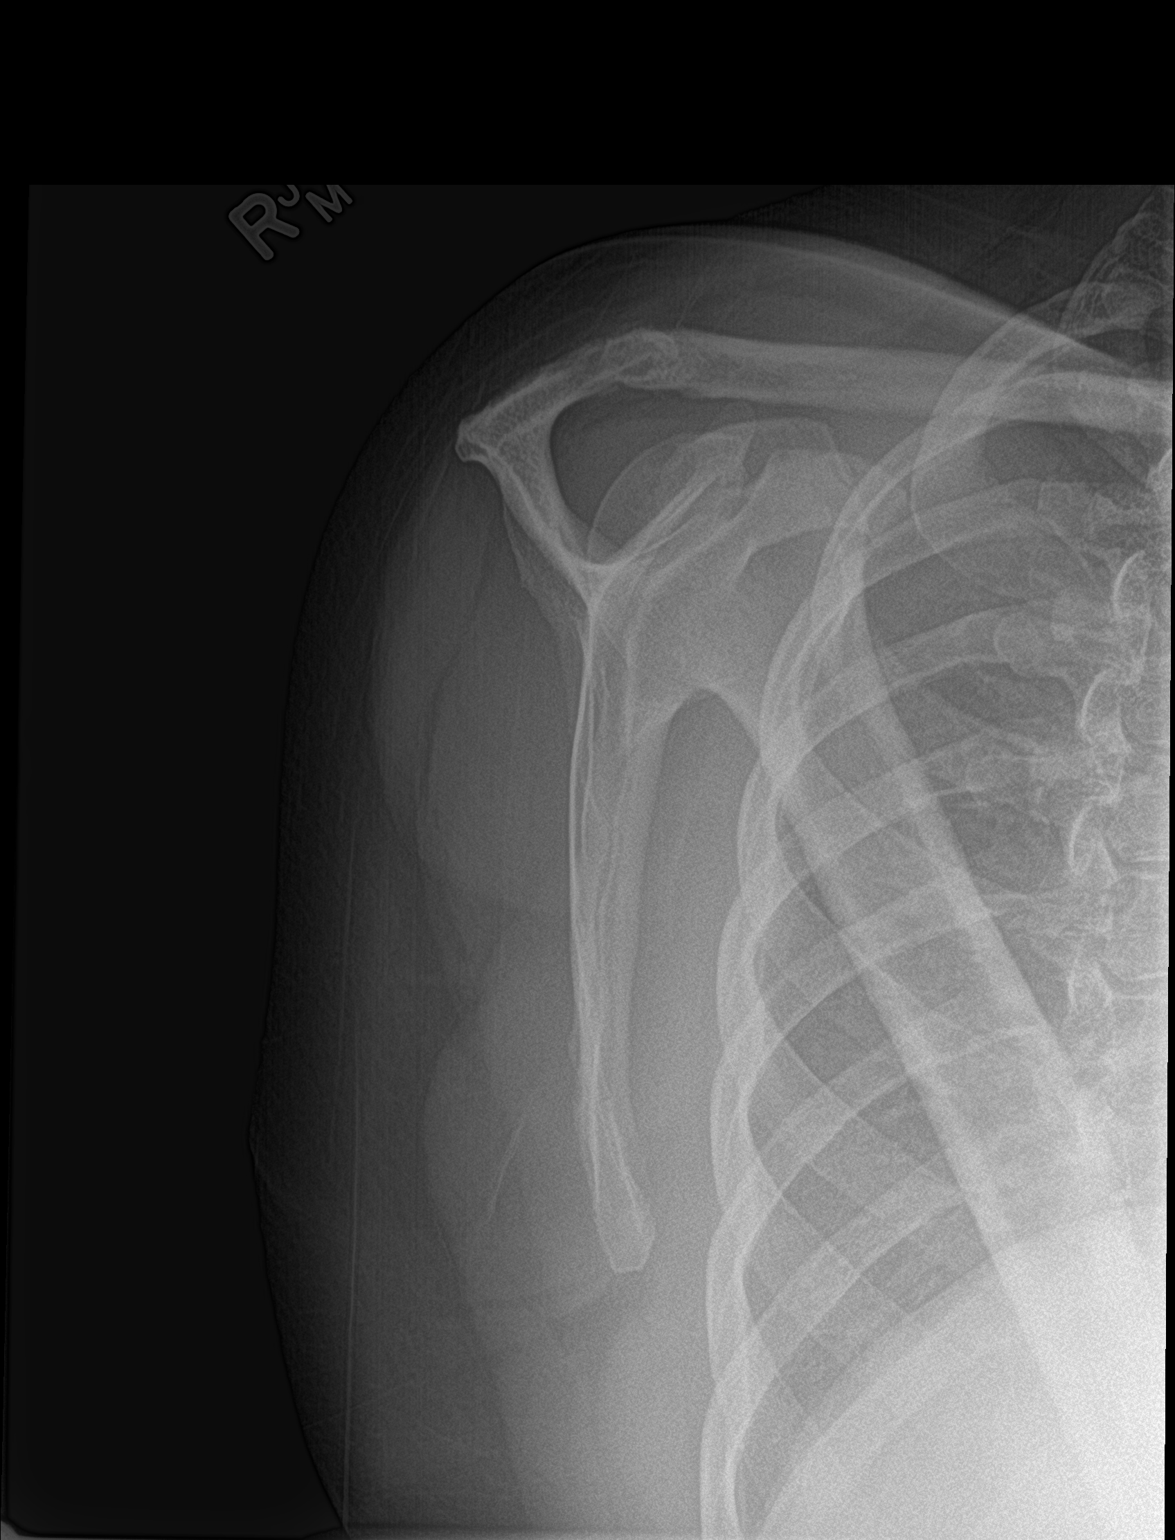

[shoulder axillary]
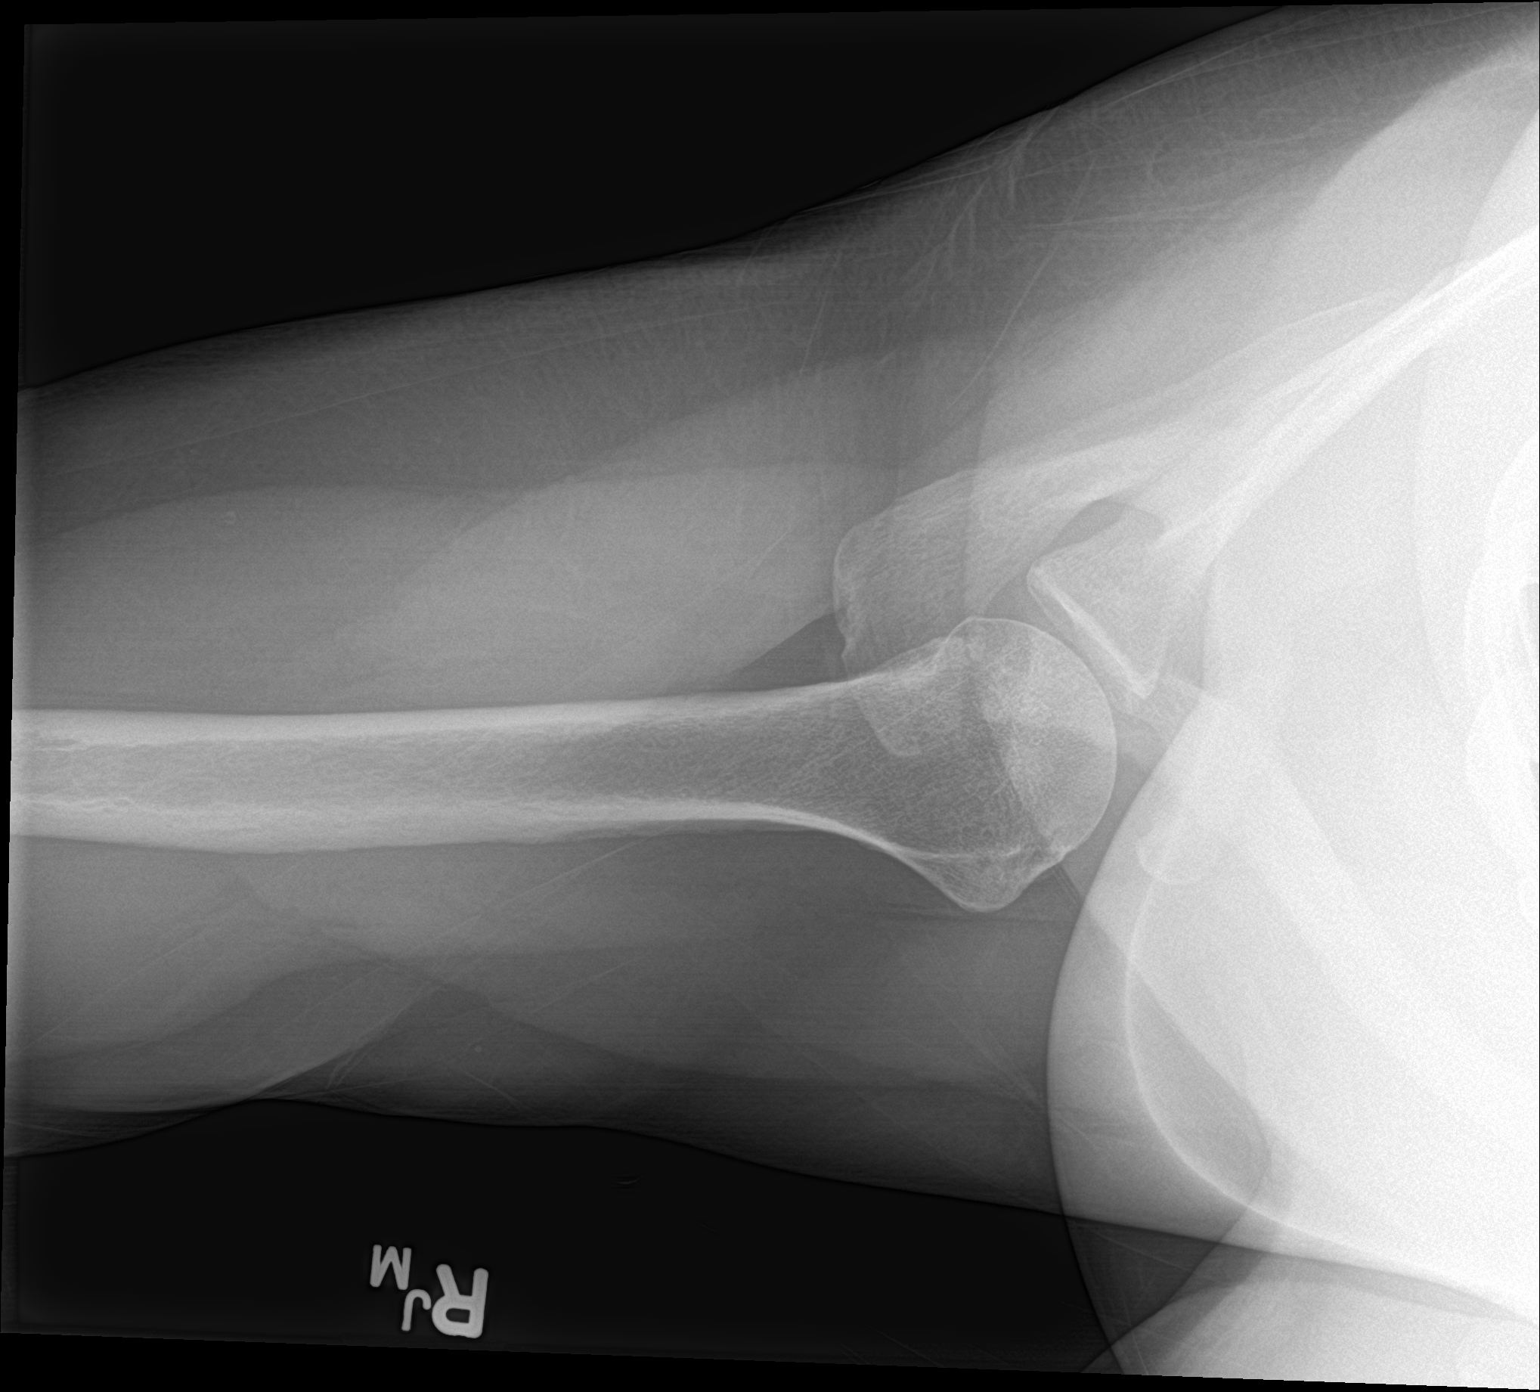

[3 of 3 positions shown; findings below may reference images not displayed]

FINDINGS: There is no evidence of fracture or dislocation. There is no
evidence of arthropathy or other focal bone abnormality. Soft
tissues are unremarkable.
IMPRESSION: Negative.
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
## 2021-07-16 NOTE — Telephone Encounter (Signed)
Informed of results.   Myra Rude, MD [facility] 07/16/2021, 2:56 PM

## 2021-07-16 NOTE — Patient Instructions (Signed)
Good to see you I will call with the xray results.  Please call [phone] to schedule the MRI   Please send me a message in MyChart with any questions or updates.  We will setup a virtual visit once the MRI is resulted.   --Dr. Jordan Likes

## 2021-08-20 ENCOUNTER — Other Ambulatory Visit: Payer: BC Managed Care – PPO

## 2021-08-21 ENCOUNTER — Other Ambulatory Visit: Payer: BC Managed Care – PPO

## 2021-09-02 ENCOUNTER — Other Ambulatory Visit: Payer: Self-pay

## 2021-09-02 ENCOUNTER — Ambulatory Visit
Admission: RE | Admit: 2021-09-02 | Discharge: 2021-09-02 | Disposition: A | Discharge status: Home / Self Care | Payer: BC Managed Care – PPO | Source: Ambulatory Visit | Attending: Family Medicine | Admitting: Family Medicine

## 2021-09-02 DIAGNOSIS — M778 Other enthesopathies, not elsewhere classified: Secondary | ICD-10-CM
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
## 2021-09-03 ENCOUNTER — Telehealth: Payer: Self-pay | Admitting: Family Medicine

## 2021-09-03 NOTE — Telephone Encounter (Signed)
Patient called states her R shoulder is now hurting worst than the Left shoulder and ask if Dr. Jordan Likes can order PT 2 [facility]hab for her.  --forwarding request to medical staff.  --glh

## 2021-09-04 ENCOUNTER — Encounter: Payer: Self-pay | Admitting: Family Medicine

## 2021-09-04 ENCOUNTER — Telehealth (INDEPENDENT_AMBULATORY_CARE_PROVIDER_SITE_OTHER): Payer: BC Managed Care – PPO | Admitting: Family Medicine

## 2021-09-04 DIAGNOSIS — M778 Other enthesopathies, not elsewhere classified: Secondary | ICD-10-CM

## 2021-09-04 DIAGNOSIS — M7592 Shoulder lesion, unspecified, left shoulder: Secondary | ICD-10-CM | POA: Diagnosis not present

## 2021-09-04 MED ORDER — DIAZEPAM 5 MG PO TABS: ORAL_TABLET | ORAL | 0 refills | Status: AC

## 2021-09-04 MED ORDER — DIAZEPAM 5 MG PO TABS: ORAL_TABLET | ORAL | 0 refills | Status: DC

## 2021-09-04 NOTE — Assessment & Plan Note (Addendum)
Findings appreciated on most recent MRI. -Counseled on home exercise therapy and supportive care. -Pursue injection. - valium for injection  -Could consider nitro or shockwave therapy.

## 2021-09-04 NOTE — Assessment & Plan Note (Signed)
MRI was revealing for findings consistent with capsulitis. -Counseled on home exercise therapy and supportive care. -Can perform Hydro dilation

## 2021-09-04 NOTE — Progress Notes (Signed)
Virtual Visit via Video Note  I connected with Cinzia Finlay on 09/04/21 at  8:00 AM EST by a video enabled telemedicine application and verified that I am speaking with the correct person using two identifiers.  Location: Patient: work Provider: office   I discussed the limitations of evaluation and management by telemedicine and the availability of in person appointments. The patient expressed understanding and agreed to proceed.  History of Present Illness:  Ms. Erck is a 51 year old female that is following up after the MRI of her left shoulder.  This was demonstrating findings suggestive of adhesive capsulitis as well as severe tendinosis of the supraspinatus.   Observations/Objective:   Assessment and Plan:  Supraspinatus tendinosis of the left shoulder: Findings appreciated on most recent MRI. -Counseled on home exercise therapy and supportive care. -Pursue injection. - valium for injection  -Could consider nitro or shockwave therapy.  Capsulitis of left shoulder: MRI was revealing for findings consistent with capsulitis. -Counseled on home exercise therapy and supportive care. -Can perform Hydro dilation  Follow Up Instructions:    I discussed the assessment and treatment plan with the patient. The patient was provided an opportunity to ask questions and all were answered. The patient agreed with the plan and demonstrated an understanding of the instructions.   The patient was advised to call back or seek an in-person evaluation if the symptoms worsen or if the condition fails to improve as anticipated.    Clare Gandy, MD

## 2021-09-13 ENCOUNTER — Encounter: Payer: Self-pay | Admitting: Family Medicine

## 2021-09-13 ENCOUNTER — Ambulatory Visit (INDEPENDENT_AMBULATORY_CARE_PROVIDER_SITE_OTHER): Payer: BC Managed Care – PPO | Admitting: Family Medicine

## 2021-09-13 ENCOUNTER — Ambulatory Visit: Payer: Self-pay

## 2021-09-13 VITALS — BP 140/100 | Ht 64.25 in | Wt 205.0 lb

## 2021-09-13 DIAGNOSIS — M778 Other enthesopathies, not elsewhere classified: Secondary | ICD-10-CM

## 2021-09-13 DIAGNOSIS — M7581 Other shoulder lesions, right shoulder: Secondary | ICD-10-CM | POA: Diagnosis not present

## 2021-09-13 MED ORDER — METHYLPREDNISOLONE ACETATE 40 MG/ML IJ SUSP
40.0000 mg | Freq: Once | INTRAMUSCULAR | Status: AC
  Administered 2021-09-13: 40 mg via INTRA_ARTICULAR

## 2021-09-13 MED ORDER — TRIAMCINOLONE ACETONIDE 40 MG/ML IJ SUSP
40.0000 mg | Freq: Once | INTRAMUSCULAR | Status: AC
  Administered 2021-09-13: 40 mg via INTRA_ARTICULAR

## 2021-09-13 NOTE — Assessment & Plan Note (Signed)
Acutely worsening.  Had limited range of motion.  Has performed over 6 weeks of physician directed home exercise therapy.  Pain is 10 out of 10. -Counseled on home exercise therapy and supportive care. -Injection today. -MRI of the right shoulder evaluate for rotator cuff tear and consideration of surgery.

## 2021-09-13 NOTE — Progress Notes (Signed)
Danaye Youngren - 51 y.o. female MRN [id number]  Date of birth: [birth year]  SUBJECTIVE:  Including CC & ROS.  No chief complaint on file.   Quatesha Fochtman is a 51 y.o. female that is presenting with acute right shoulder pain.  She is having trouble with abduction.  The pain is severe in nature.  She is also presenting with acute on chronic left shoulder pain.  We have tried more than 6 weeks of home exercise regimen that was physician supervised.  Her pain on the right shoulder is 10 out of 10.  It is localized to the shoulder.   Review of Systems See HPI   HISTORY: Past Medical, Surgical, Social, and Family History Reviewed & Updated per EMR.   Pertinent Historical Findings include:  Past Medical History:  Diagnosis Date   Diabetes mellitus without complication ([place])    Hypertension     Past Surgical History:  Procedure Laterality Date   CHOLECYSTECTOMY     HERNIA REPAIR     THYROID SURGERY     TUBAL LIGATION       PHYSICAL EXAM:  VS: BP (!) 140/100 (BP Location: Left Arm, Patient Position: Sitting)    Ht 5' 4.25" (1.632 m)    Wt 205 lb (93 kg)    BMI 34.91 kg/m  Physical Exam Gen: NAD, alert, cooperative with exam, well-appearing MSK:  Neurovascularly intact     Aspiration/Injection Procedure Note Rozlyn Chuba 02/26/1971  Procedure: Injection Indications: Right shoulder pain  Procedure Details Consent: Risks of procedure as well as the alternatives and risks of each were explained to the (patient/caregiver).  Consent for procedure obtained. Time Out: Verified patient identification, verified procedure, site/side was marked, verified correct patient position, special equipment/implants available, medications/allergies/relevent history reviewed, required imaging and test results available.  Performed.  The area was cleaned with iodine and alcohol swabs.    The right subacromial space was injected using 1 cc's of 40 mg Depo-Medrol and 4 cc's of 0.25% bupivacaine  with a 25 1 1/2" needle.  Ultrasound was used. Images were obtained in long views showing the injection.     A sterile dressing was applied.  Patient did tolerate procedure well.   Aspiration/Injection Procedure Note Dorette Fogelman 1971-07-07  Procedure: Injection Indications: Left shoulder pain  Procedure Details Consent: Risks of procedure as well as the alternatives and risks of each were explained to the (patient/caregiver).  Consent for procedure obtained. Time Out: Verified patient identification, verified procedure, site/side was marked, verified correct patient position, special equipment/implants available, medications/allergies/relevent history reviewed, required imaging and test results available.  Performed.  The area was cleaned with iodine and alcohol swabs.    The left glenohumeral joint was injected using 3 cc of 1% lidocaine on a 22-gauge 3-1/2 inch needle.  The syringe was switched to mixture containing 1 cc's of 40 mg Kenalog and and 3 cc's of 0.25% bupivacaine and 3 cc of 1% lidocaine were injected.  Ultrasound was used. Images were obtained in short views showing the injection.     A sterile dressing was applied.  Patient did tolerate procedure well.     ASSESSMENT & PLAN:   Rotator cuff tendinitis, right Acutely worsening.  Had limited range of motion.  Has performed over 6 weeks of physician directed home exercise therapy.  Pain is 10 out of 10. -Counseled on home exercise therapy and supportive care. -Injection today. -MRI of the right shoulder evaluate for rotator cuff tear and consideration of surgery.  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  Having changes appreciated on MRI. -Counseled on home exercise therapy and supportive care. -Glenohumeral injection. -Could consider physical therapy.

## 2021-09-13 NOTE — Patient Instructions (Signed)
Good to see you Please use ice as needed  Please call [phone] to schedule the MRI on the right shoulder   Please send me a message in MyChart with any questions or updates.  We will setup a virtual visit once the right shoulder MRI is resulted.   --Dr. Jordan Likes

## 2021-09-13 NOTE — Assessment & Plan Note (Signed)
Acute on chronic in nature.  Having changes appreciated on MRI. -Counseled on home exercise therapy and supportive care. -Glenohumeral injection. -Could consider physical therapy.

## 2021-10-03 ENCOUNTER — Telehealth: Payer: Self-pay | Admitting: Family Medicine

## 2021-10-03 NOTE — Telephone Encounter (Signed)
Pt called states she was contacted by MRI dept stating her MRI was Cancelled due to unknown reason--unable to locate Rad note in Epic--see a Pending note in referral order.  Just an FYI and wonder what is next step.  -glh

## 2021-10-04 ENCOUNTER — Other Ambulatory Visit: Payer: BC Managed Care – PPO

## 2021-10-09 NOTE — Telephone Encounter (Signed)
Called pt to advise she can now reschedule her MRI. It has been authorized per [employer].  No answer/unable to leave vm.

## 2021-10-09 NOTE — Telephone Encounter (Signed)
Right shoulder MRI is authorized per Morgan Frey. Pt informed she can now call [facility] and schedule MRI.

## 2021-10-14 ENCOUNTER — Ambulatory Visit
Admission: RE | Admit: 2021-10-14 | Discharge: 2021-10-14 | Disposition: A | Discharge status: Home / Self Care | Payer: BC Managed Care – PPO | Source: Ambulatory Visit | Attending: Family Medicine | Admitting: Family Medicine

## 2021-10-14 ENCOUNTER — Other Ambulatory Visit: Payer: Self-pay

## 2021-10-14 DIAGNOSIS — M7581 Other shoulder lesions, right shoulder: Secondary | ICD-10-CM

## 2021-10-14 IMAGING — MR MR SHOULDER*R* W/O CM
4 of 5 series · 21 of 40 positions shown · non-contrast
Comparison: None.

CLINICAL DATA: Shoulder pain

EXAM:
MRI OF THE RIGHT SHOULDER WITHOUT CONTRAST
TECHNIQUE: Multiplanar, multisequence MR imaging of the shoulder was performed.
No intravenous contrast was administered.

[Series 10: T2 fat-sat · axial · right · 3.0mm · 0.50mm/px · z∈[-43,+57]mm · 8 of 27 slices shown (1 of 3)]
[im 1/27]
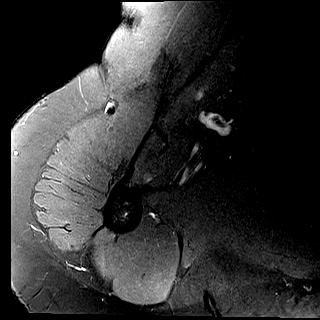
[im 3/27]
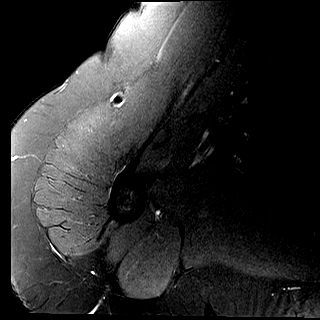
[im 9/27]
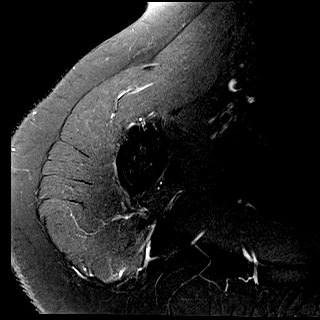
[im 12/27]
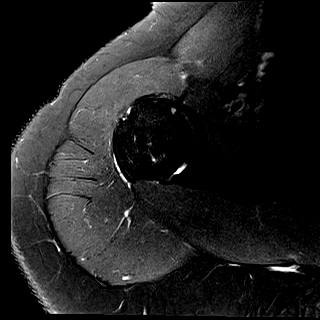
[im 15/27]
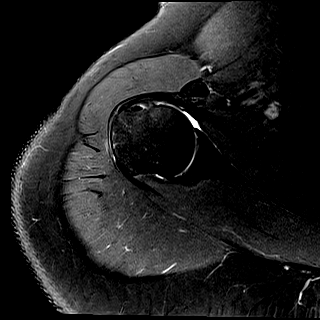
[im 18/27]
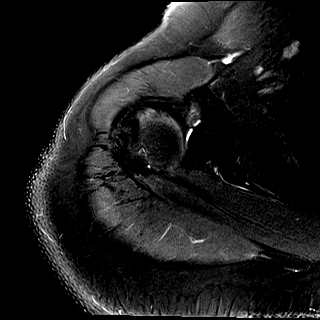
[im 24/27]
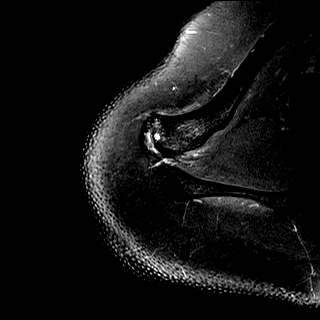
[im 27/27]
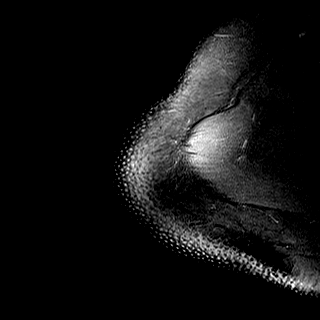

[Series 12: T2 fat-sat · oblique · right · 4.0mm · 0.23mm/px · 3 of 21 slices shown (2 of 3)]
[im 4/21]
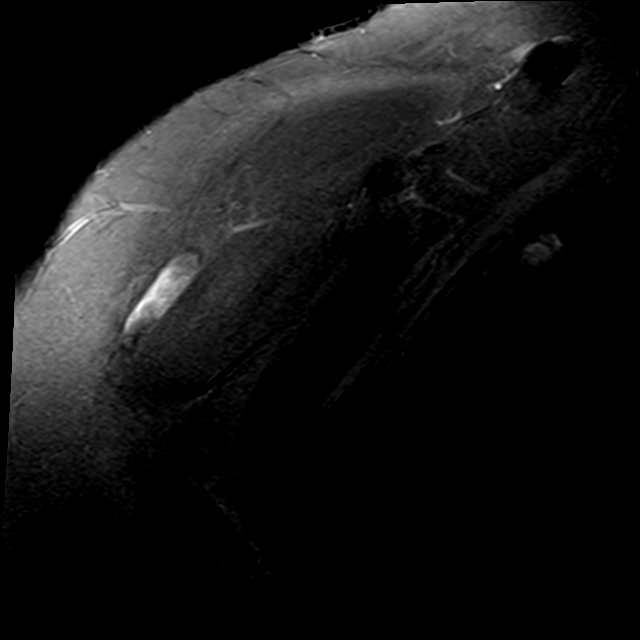
[im 11/21]
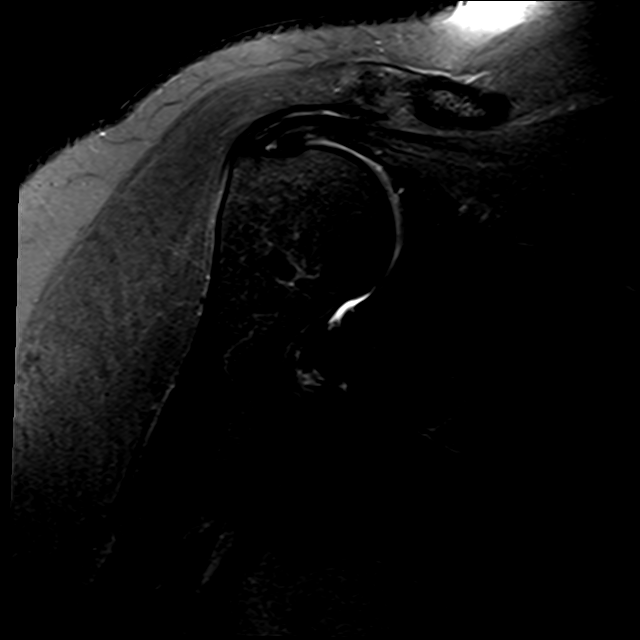
[im 17/21]
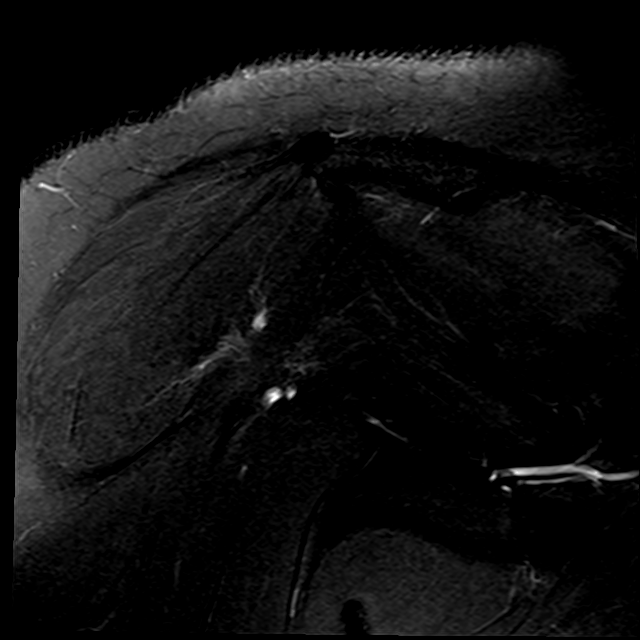

[Series 13: PD · oblique · right · 4.0mm · 0.23mm/px · 7 of 21 slices shown]
[im 1/21]
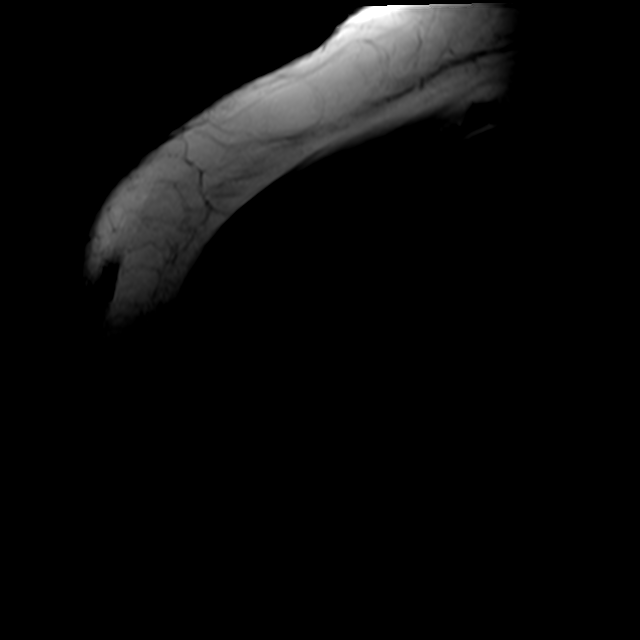
[im 4/21]
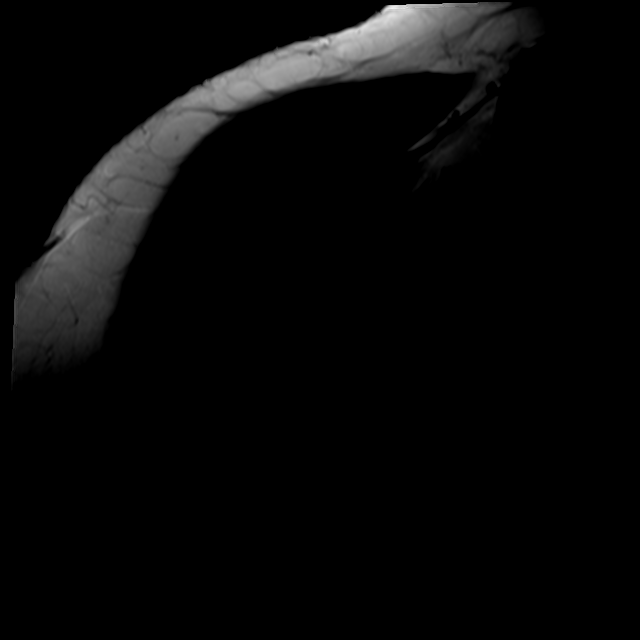
[im 7/21]
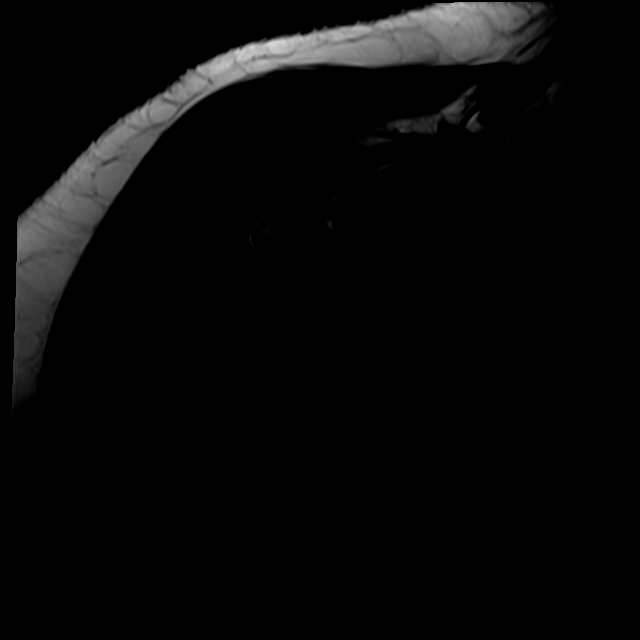
[im 11/21]
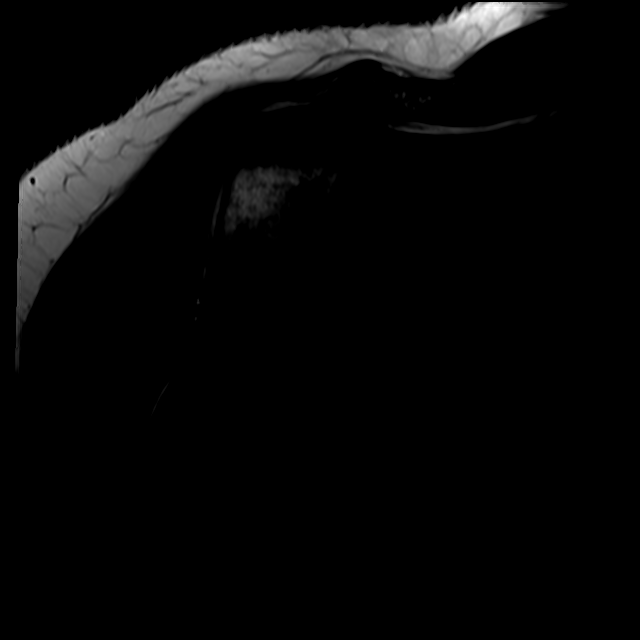
[im 14/21]
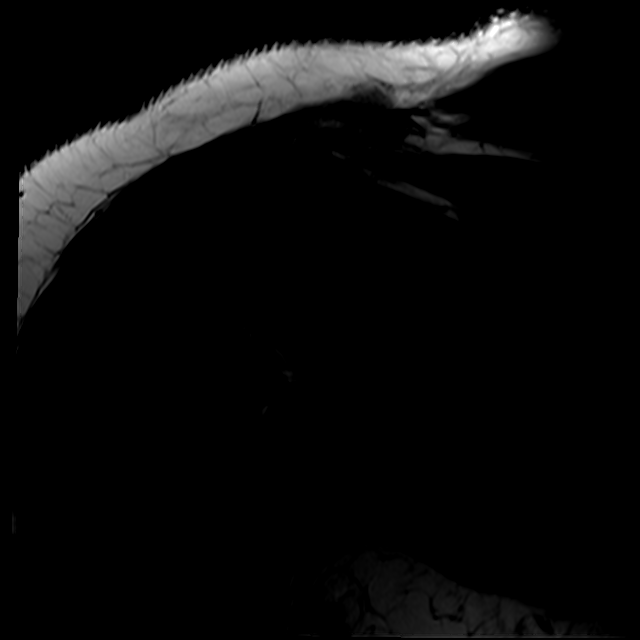
[im 17/21]
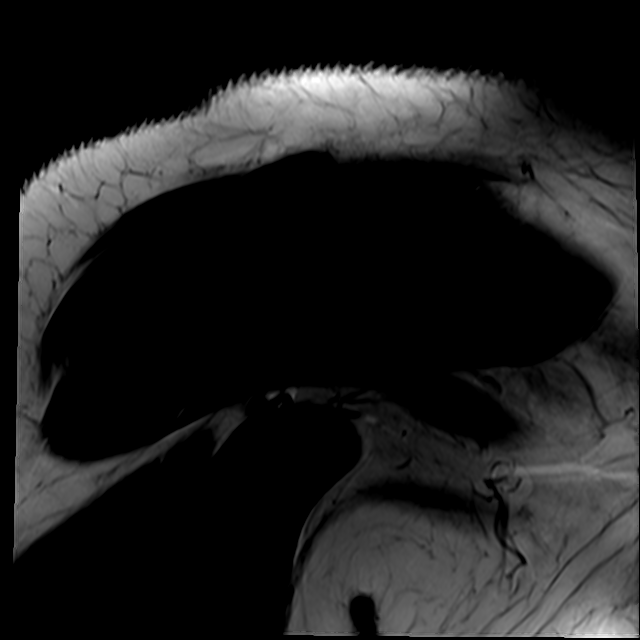
[im 21/21]
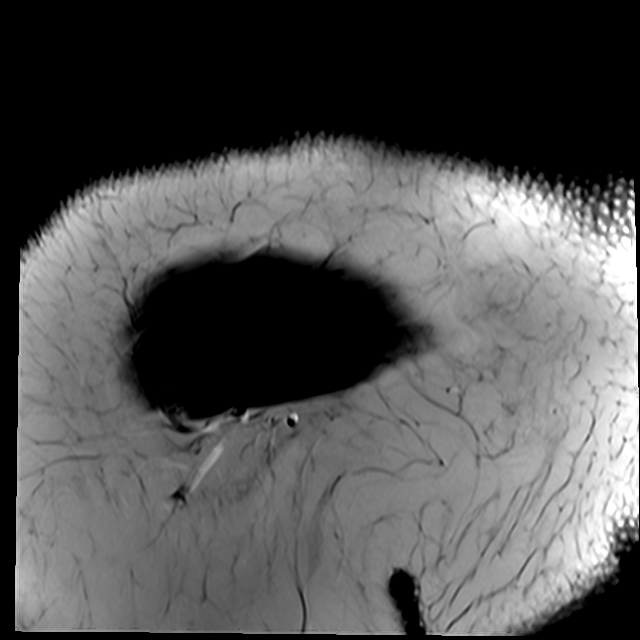

[Series 14: T2 fat-sat · oblique · right · 4.0mm · 0.44mm/px · 3 of 23 slices shown (3 of 3)]
[im 4/23]
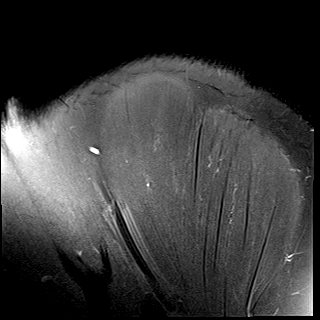
[im 13/23]
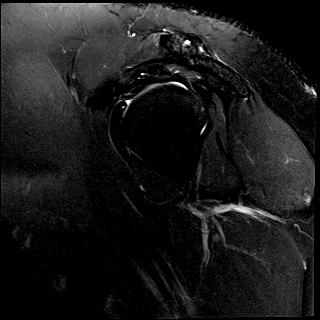
[im 19/23]
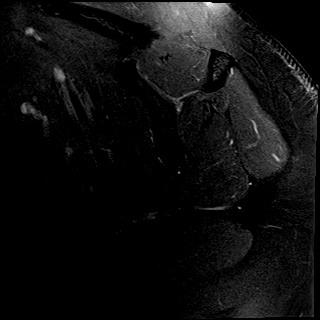

[21 of 40 positions shown; findings below may reference images not displayed]

FINDINGS: Rotator cuff: Supraspinatus tendinopathy without discrete tear.
Infraspinatus tendon is intact. Teres minor tendon is intact.
Subscapularis tendon is intact.

Muscles: No muscle atrophy or edema. No intramuscular fluid
collection or hematoma.

Biceps Long Head: Intraarticular and extraarticular portions of the
biceps tendon are intact.

Acromioclavicular Joint: Moderate arthropathy of the
acromioclavicular joint characterized by joint capsule thickening
and subchondral cystic changes. No subacromial/subdeltoid bursal
fluid.

Glenohumeral Joint: No joint effusion. No chondral defect.

Labrum: Grossly intact, but evaluation is limited by lack of
intraarticular fluid/contrast.

Bones: No fracture or dislocation. No aggressive osseous lesion.

Other: No fluid collection or hematoma.
IMPRESSION: 1. Mild supraspinatus tendinopathy without discrete tear. The
remaining rotator cuff tendons are intact. No muscle atrophy.

2.  Moderate acromioclavicular osteoarthritis.

3.  No evidence of fracture or dislocation.

4.  No evidence of bursitis.

## 2021-10-16 ENCOUNTER — Other Ambulatory Visit: Payer: BC Managed Care – PPO

## 2021-10-17 ENCOUNTER — Encounter: Payer: Self-pay | Admitting: Family Medicine

## 2021-10-17 ENCOUNTER — Telehealth (INDEPENDENT_AMBULATORY_CARE_PROVIDER_SITE_OTHER): Payer: BC Managed Care – PPO | Admitting: Family Medicine

## 2021-10-17 DIAGNOSIS — M7581 Other shoulder lesions, right shoulder: Secondary | ICD-10-CM

## 2021-10-17 NOTE — Progress Notes (Signed)
Virtual Visit via Video Note  I connected with Morgan Frey on 10/17/21 at  1:00 PM EST by a video enabled telemedicine application and verified that I am speaking with the correct person using two identifiers.  Location: Patient: work Provider: office   I discussed the limitations of evaluation and management by telemedicine and the availability of in person appointments. The patient expressed understanding and agreed to proceed.  History of Present Illness:  Morgan Frey is a 51 year old female is following up after MRI of her right shoulder.  This was demonstrating mild supraspinatus tendinopathy.  It also showed moderate AC joint arthritis.  Observations/Objective:   Assessment and Plan:  Right rotator cuff tendinopathy: MRI is revealing is tendinopathy.  We have tried an injection and has gotten some improvement. - counseled on home exercise therapy and supportive care -Pursue shockwave therapy.  Follow Up Instructions:    I discussed the assessment and treatment plan with the patient. The patient was provided an opportunity to ask questions and all were answered. The patient agreed with the plan and demonstrated an understanding of the instructions.   The patient was advised to call back or seek an in-person evaluation if the symptoms worsen or if the condition fails to improve as anticipated.    Clare Gandy, MD

## 2021-10-17 NOTE — Assessment & Plan Note (Signed)
MRI is revealing is tendinopathy.  We have tried an injection and has gotten some improvement. - counseled on home exercise therapy and supportive care -Pursue shockwave therapy.

## 2021-10-31 ENCOUNTER — Encounter: Payer: Self-pay | Admitting: Family Medicine

## 2021-10-31 ENCOUNTER — Ambulatory Visit (INDEPENDENT_AMBULATORY_CARE_PROVIDER_SITE_OTHER): Payer: Self-pay | Admitting: Family Medicine

## 2021-10-31 DIAGNOSIS — M7581 Other shoulder lesions, right shoulder: Secondary | ICD-10-CM

## 2021-10-31 NOTE — Progress Notes (Signed)
?  Evoleht Hovatter - 51 y.o. female MRN [id number]  Date of birth: July 25, 1971 ? ?SUBJECTIVE:  Including CC & ROS.  ?No chief complaint on file. ? ? ?Jamyia Fortune is a 51 y.o. female that is here for shockwave therapy. ? ? ?Review of Systems ?See HPI  ? ?HISTORY: Past Medical, Surgical, Social, and Family History Reviewed & Updated per EMR.   ?Pertinent Historical Findings include: ? ?Past Medical History:  ?Diagnosis Date  ? Diabetes mellitus without complication (HCC)   ? Hypertension   ? ? ?Past Surgical History:  ?Procedure Laterality Date  ? CHOLECYSTECTOMY    ? HERNIA REPAIR    ? THYROID SURGERY    ? TUBAL LIGATION    ? ? ? ?PHYSICAL EXAM:  ?VS: Ht 5' 4.25" (1.632 m)   Wt 205 lb (93 kg)   BMI 34.91 kg/m?  ?Physical Exam ?Gen: NAD, alert, cooperative with exam, well-appearing ?MSK:  ?Neurovascularly intact   ? ?ECSWT Note ?Ceylin Hottenstein ?09-30-1970 ? ?Procedure: ECSWT ?Indications: right shoulder pain  ? ?Procedure Details ?Consent: Risks of procedure as well as the alternatives and risks of each were explained to the (patient/caregiver).  Consent for procedure obtained. ?Time Out: Verified patient identification, verified procedure, site/side was marked, verified correct patient position, special equipment/implants available, medications/allergies/relevent history reviewed, required imaging and test results available.  Performed.  The area was cleaned with iodine and alcohol swabs.   ? ?The right shoulder was targeted for Extracorporeal shockwave therapy.  ? ?Preset: shoulder problems ?Power Level: 40 ?Frequency: 10 ?Impulse/cycles: 2600 ?Head size: medium  ?Session: 1st ? ?Patient did tolerate procedure well. ? ? ? ?ASSESSMENT & PLAN:  ? ?Rotator cuff tendinitis, right ?Completed shockwave therapy  ? ? ? ? ?

## 2021-10-31 NOTE — Assessment & Plan Note (Signed)
Completed shockwave therapy  

## 2021-11-08 ENCOUNTER — Ambulatory Visit: Payer: BC Managed Care – PPO | Admitting: Family Medicine

## 2021-11-15 ENCOUNTER — Ambulatory Visit (INDEPENDENT_AMBULATORY_CARE_PROVIDER_SITE_OTHER): Payer: Self-pay | Admitting: Family Medicine

## 2021-11-15 DIAGNOSIS — M7581 Other shoulder lesions, right shoulder: Secondary | ICD-10-CM

## 2021-11-15 NOTE — Assessment & Plan Note (Signed)
Completed shockwave therapy  

## 2021-11-15 NOTE — Progress Notes (Signed)
?  Morgan Frey - 51 y.o. female MRN [id number]  Date of birth: April 30, 1971 ? ?SUBJECTIVE:  Including CC & ROS.  ?No chief complaint on file. ? ? ?Morgan Frey is a 51 y.o. female that is  here for shockwave therapy . ? ? ?Review of Systems ?See HPI  ? ?HISTORY: Past Medical, Surgical, Social, and Family History Reviewed & Updated per EMR.   ?Pertinent Historical Findings include: ? ?Past Medical History:  ?Diagnosis Date  ? Diabetes mellitus without complication ([place])   ? Hypertension   ? ? ?Past Surgical History:  ?Procedure Laterality Date  ? CHOLECYSTECTOMY    ? HERNIA REPAIR    ? THYROID SURGERY    ? TUBAL LIGATION    ? ? ? ?PHYSICAL EXAM:  ?VS: Ht 5' 4.25" (1.632 m)   BMI 34.91 kg/m?  ?Physical Exam ?Gen: NAD, alert, cooperative with exam, well-appearing ?MSK:  ?Neurovascularly intact   ? ?ECSWT Note ?Morgan Frey ?Aug 22, 1971 ? ?Procedure: ECSWT ?Indications: right shoulder pain  ? ?Procedure Details ?Consent: Risks of procedure as well as the alternatives and risks of each were explained to the (patient/caregiver).  Consent for procedure obtained. ?Time Out: Verified patient identification, verified procedure, site/side was marked, verified correct patient position, special equipment/implants available, medications/allergies/relevent history reviewed, required imaging and test results available.  Performed.  The area was cleaned with iodine and alcohol swabs.   ? ?The right shoulder was targeted for Extracorporeal shockwave therapy.  ? ?Preset: shoulder problems ?Power Level: 40 ?Frequency: 10 ?Impulse/cycles: 2800 ?Head size: medium  ?Session: 2nd ? ?Patient did tolerate procedure well. ? ? ? ?ASSESSMENT & PLAN:  ? ?Rotator cuff tendinitis, right ?Completed shockwave therapy ? ? ? ? ?

## 2021-11-21 ENCOUNTER — Ambulatory Visit (INDEPENDENT_AMBULATORY_CARE_PROVIDER_SITE_OTHER): Payer: Self-pay | Admitting: Family Medicine

## 2021-11-21 ENCOUNTER — Encounter: Payer: Self-pay | Admitting: Family Medicine

## 2021-11-21 DIAGNOSIS — M7581 Other shoulder lesions, right shoulder: Secondary | ICD-10-CM

## 2021-11-21 NOTE — Progress Notes (Signed)
?  Morgan Frey - 51 y.o. female MRN [id number]  Date of birth: [birth year] ? ?SUBJECTIVE:  Including CC & ROS.  ?No chief complaint on file. ? ? ?Morgan Frey is a 51 y.o. female that is  here for shockwave therapy. ? ? ?Review of Systems ?See HPI  ? ?HISTORY: Past Medical, Surgical, Social, and Family History Reviewed & Updated per EMR.   ?Pertinent Historical Findings include: ? ?Past Medical History:  ?Diagnosis Date  ? Diabetes mellitus without complication ([facility])   ? Hypertension   ? ? ?Past Surgical History:  ?Procedure Laterality Date  ? CHOLECYSTECTOMY    ? HERNIA REPAIR    ? THYROID SURGERY    ? TUBAL LIGATION    ? ? ? ?PHYSICAL EXAM:  ?VS: Ht 5' 4.25" (1.632 m)   Wt 205 lb (93 kg)   BMI 34.91 kg/m?  ?Physical Exam ?Gen: NAD, alert, cooperative with exam, well-appearing ?MSK:  ?Neurovascularly intact   ? ?ECSWT Note ?Morgan Frey ?[birth year] ? ?Procedure: ECSWT ?Indications: right shoulder pain  ? ?Procedure Details ?Consent: Risks of procedure as well as the alternatives and risks of each were explained to the (patient/caregiver).  Consent for procedure obtained. ?Time Out: Verified patient identification, verified procedure, site/side was marked, verified correct patient position, special equipment/implants available, medications/allergies/relevent history reviewed, required imaging and test results available.  Performed.  The area was cleaned with iodine and alcohol swabs.   ? ?The right shoulder pain was targeted for Extracorporeal shockwave therapy.  ? ?Preset: shoulder problems ?Power Level: 50 ?Frequency: 10 ?Impulse/cycles: 3000 ?Head size: medium  ?Session: 3rd ? ?Patient did tolerate procedure well. ? ? ? ?ASSESSMENT & PLAN:  ? ?Rotator cuff tendinitis, right ?Completed shockwave therapy ? ? ? ? ?

## 2021-11-21 NOTE — Assessment & Plan Note (Signed)
Completed shockwave therapy  

## 2021-11-26 ENCOUNTER — Ambulatory Visit: Payer: BC Managed Care – PPO | Admitting: Family Medicine

## 2021-11-28 ENCOUNTER — Ambulatory Visit (INDEPENDENT_AMBULATORY_CARE_PROVIDER_SITE_OTHER): Payer: Self-pay | Admitting: Family Medicine

## 2021-11-28 ENCOUNTER — Encounter: Payer: Self-pay | Admitting: Family Medicine

## 2021-11-28 DIAGNOSIS — M7581 Other shoulder lesions, right shoulder: Secondary | ICD-10-CM

## 2021-11-28 NOTE — Progress Notes (Signed)
?  Morgan Frey - 51 y.o. female MRN [id number]  Date of birth: [birth year] ? ?SUBJECTIVE:  Including CC & ROS.  ?No chief complaint on file. ? ? ?Morgan Frey is a 51 y.o. female that is  here for shockwave therapy. ? ? ? ?Review of Systems ?See HPI  ? ?HISTORY: Past Medical, Surgical, Social, and Family History Reviewed & Updated per EMR.   ?Pertinent Historical Findings include: ? ?Past Medical History:  ?Diagnosis Date  ? Diabetes mellitus without complication ([facility])   ? Hypertension   ? ? ?Past Surgical History:  ?Procedure Laterality Date  ? CHOLECYSTECTOMY    ? HERNIA REPAIR    ? THYROID SURGERY    ? TUBAL LIGATION    ? ? ? ?PHYSICAL EXAM:  ?VS: Ht 5' 4.25" (1.632 m)   Wt 205 lb (93 kg)   BMI 34.91 kg/m?  ?Physical Exam ?Gen: NAD, alert, cooperative with exam, well-appearing ?MSK:  ?Neurovascularly intact   ? ?ECSWT Note ?Kellyn Rance ?April 23, 1971 ? ?Procedure: ECSWT ?Indications: right shoulder pain  ? ?Procedure Details ?Consent: Risks of procedure as well as the alternatives and risks of each were explained to the (patient/caregiver).  Consent for procedure obtained. ?Time Out: Verified patient identification, verified procedure, site/side was marked, verified correct patient position, special equipment/implants available, medications/allergies/relevent history reviewed, required imaging and test results available.  Performed.  The area was cleaned with iodine and alcohol swabs.   ? ?The right shoulder was targeted for Extracorporeal shockwave therapy.  ? ?Preset: shoulder problems ?Power Level: 70 ?Frequency: 10 ?Impulse/cycles: 3000 ?Head size: medium  ?Session: 4th ? ?Patient did tolerate procedure well. ? ? ? ?ASSESSMENT & PLAN:  ? ?Rotator cuff tendinitis, right ?Completed shockwave therapy  ? ? ? ? ?

## 2021-11-28 NOTE — Assessment & Plan Note (Signed)
Completed shockwave therapy  

## 2022-12-09 ENCOUNTER — Encounter: Payer: Self-pay | Admitting: *Deleted

## 2023-11-22 ENCOUNTER — Encounter (HOSPITAL_BASED_OUTPATIENT_CLINIC_OR_DEPARTMENT_OTHER): Payer: Self-pay | Admitting: Emergency Medicine

## 2023-11-22 ENCOUNTER — Emergency Department (HOSPITAL_BASED_OUTPATIENT_CLINIC_OR_DEPARTMENT_OTHER)
Admission: EM | Admit: 2023-11-22 | Discharge: 2023-11-23 | Disposition: A | Discharge status: Home / Self Care | Attending: Emergency Medicine | Admitting: Emergency Medicine

## 2023-11-22 ENCOUNTER — Other Ambulatory Visit: Payer: Self-pay

## 2023-11-22 ENCOUNTER — Emergency Department (HOSPITAL_BASED_OUTPATIENT_CLINIC_OR_DEPARTMENT_OTHER)

## 2023-11-22 DIAGNOSIS — R1030 Lower abdominal pain, unspecified: Secondary | ICD-10-CM | POA: Insufficient documentation

## 2023-11-22 DIAGNOSIS — R519 Headache, unspecified: Secondary | ICD-10-CM | POA: Diagnosis not present

## 2023-11-22 DIAGNOSIS — I10 Essential (primary) hypertension: Secondary | ICD-10-CM | POA: Insufficient documentation

## 2023-11-22 DIAGNOSIS — E119 Type 2 diabetes mellitus without complications: Secondary | ICD-10-CM | POA: Insufficient documentation

## 2023-11-22 DIAGNOSIS — R030 Elevated blood-pressure reading, without diagnosis of hypertension: Secondary | ICD-10-CM | POA: Diagnosis present

## 2023-11-22 LAB — CBC WITH DIFFERENTIAL/PLATELET
Abs Immature Granulocytes: 0.01 10*3/uL (ref 0.00–0.07)
Basophils Absolute: 0 10*3/uL (ref 0.0–0.1)
Basophils Relative: 0 %
Eosinophils Absolute: 0.1 10*3/uL (ref 0.0–0.5)
Eosinophils Relative: 2 %
HCT: 37.5 % (ref 36.0–46.0)
Hemoglobin: 12.5 g/dL (ref 12.0–15.0)
Immature Granulocytes: 0 %
Lymphocytes Relative: 48 %
Lymphs Abs: 2.8 10*3/uL (ref 0.7–4.0)
MCH: 29.8 pg (ref 26.0–34.0)
MCHC: 33.3 g/dL (ref 30.0–36.0)
MCV: 89.5 fL (ref 80.0–100.0)
Monocytes Absolute: 0.5 10*3/uL (ref 0.1–1.0)
Monocytes Relative: 8 %
Neutro Abs: 2.5 10*3/uL (ref 1.7–7.7)
Neutrophils Relative %: 42 %
Platelets: 262 10*3/uL (ref 150–400)
RBC: 4.19 MIL/uL (ref 3.87–5.11)
RDW: 13.7 % (ref 11.5–15.5)
WBC: 5.9 10*3/uL (ref 4.0–10.5)
nRBC: 0 % (ref 0.0–0.2)

## 2023-11-22 LAB — BASIC METABOLIC PANEL WITH GFR
Anion gap: 8 (ref 5–15)
BUN: 9 mg/dL (ref 6–20)
CO2: 25 mmol/L (ref 22–32)
Calcium: 8.9 mg/dL (ref 8.9–10.3)
Chloride: 105 mmol/L (ref 98–111)
Creatinine, Ser: 0.81 mg/dL (ref 0.44–1.00)
GFR, Estimated: >60 mL/min (ref >60)
Glucose, Bld: 142 mg/dL — ABNORMAL HIGH (ref 70–99)
Potassium: 3.7 mmol/L (ref 3.5–5.1)
Sodium: 138 mmol/L (ref 135–145)

## 2023-11-22 NOTE — ED Triage Notes (Signed)
 Pt with intermittent sharp pains to RT side head and face since yesterday; was seen by PCP for same- HTN meds increased and told to go to ED if BP did not decrease

## 2023-11-22 NOTE — ED Provider Notes (Signed)
 [place] [facility] Provider Note   CSN: [id number] Arrival date & time: 11/22/23  1951     History  Chief Complaint  Patient presents with   Hypertension    Morgan Frey is a 53 y.o. female.   Hypertension Associated symptoms include headaches.  Patient is a 53 year old female ED today complaining of elevated blood pressure and intermittent right sided headache.  Describes the headache as a sharp shooting pain that only lasts for a few seconds but is intermittent and not activity dependent, not time dependent.  York Spaniel that she saw her primary care yesterday and had blood pressure medication increased and told to come to the ER if blood pressure did not come down.  Also notes having intermittent right sided "tightness."  That she states is not weakness.Currently asymptomatic.  Denies fever, fatigue, vision changes, photophobia, cough, tension, shortness of breath, chest pain, abdominal pain, nausea, vomiting, dysuria, lower leg swelling     Home Medications Prior to Admission medications   Medication Sig Start Date End Date Taking? Authorizing Provider  diazepam (VALIUM) 5 MG tablet To be taken 30 minutes prior to procedure. May repeat x 1. 09/04/21   Myra Rude, MD  LEVOTHYROXINE SODIUM PO Take by mouth.    [provider]  LISINOPRIL PO Take by mouth.    [provider]  meloxicam (MOBIC) 15 MG tablet Take 1 tablet (15 mg total) by mouth daily. 12/27/15   Palumbo, April, MD  methocarbamol (ROBAXIN) 500 MG tablet Take 1 tablet (500 mg total) by mouth 2 (two) times daily. 12/27/15   Palumbo, April, MD  predniSONE (DELTASONE) 20 MG tablet 3 tabs po day one, then 2 po daily x 4 days 12/27/15   Palumbo, April, MD  Pregabalin (LYRICA PO) Take by mouth.    [provider]      Allergies    Patient has no known allergies.    Review of Systems   Review of Systems  Neurological:  Positive for headaches.  All other  systems reviewed and are negative.   Physical Exam Updated Vital Signs BP (!) 148/77   Pulse 72   Temp 98.2 F (36.8 C)   Resp 18   SpO2 97%  Physical Exam Vitals and nursing note reviewed.  Constitutional:      General: She is not in acute distress.    Appearance: Normal appearance. She is not ill-appearing.  HENT:     Head: Normocephalic and atraumatic.     Mouth/Throat:     Mouth: Mucous membranes are moist.     Pharynx: Oropharynx is clear. No oropharyngeal exudate or posterior oropharyngeal erythema.  Eyes:     General: No scleral icterus.       Right eye: No discharge.        Left eye: No discharge.     Extraocular Movements: Extraocular movements intact.     Conjunctiva/sclera: Conjunctivae normal.  Cardiovascular:     Rate and Rhythm: Normal rate and regular rhythm.     Pulses: Normal pulses.     Heart sounds: Normal heart sounds. No murmur heard.    No friction rub. No gallop.  Pulmonary:     Effort: Pulmonary effort is normal. No respiratory distress.     Breath sounds: Normal breath sounds. No stridor. No wheezing, rhonchi or rales.  Abdominal:     General: Abdomen is flat. There is no distension.     Palpations: Abdomen is soft.  Tenderness: There is no abdominal tenderness. There is no right CVA tenderness, left CVA tenderness or guarding.  Musculoskeletal:        General: No deformity.     Cervical back: No rigidity.     Right lower leg: No edema.     Left lower leg: No edema.  Skin:    General: Skin is warm and dry.     Capillary Refill: Capillary refill takes less than 2 seconds.     Findings: No bruising.  Neurological:     General: No focal deficit present.     Mental Status: She is alert and oriented to person, place, and time. Mental status is at baseline.     Cranial Nerves: No cranial nerve deficit.     Sensory: No sensory deficit.     Motor: No weakness.     Coordination: Coordination normal.     Gait: Gait normal.     Comments: No arm  drift, normal flexion extension to both upper and lower extremities, no sensory deficits, no aphasia, no ataxia  Psychiatric:        Mood and Affect: Mood normal.     ED Results / Procedures / Treatments   Labs (all labs ordered are listed, but only abnormal results are displayed) Labs Reviewed  BASIC METABOLIC PANEL WITH GFR - Abnormal; Notable for the following components:      Result Value   Glucose, Bld 142 (*)    All other components within normal limits  CBC WITH DIFFERENTIAL/PLATELET    EKG None  Radiology CT Head Wo Contrast Result Date: 11/22/2023 CLINICAL DATA:  Headache, new onset (Age >= 51y) EXAM: CT HEAD WITHOUT CONTRAST TECHNIQUE: Contiguous axial images were obtained from the base of the skull through the vertex without intravenous contrast. RADIATION DOSE REDUCTION: This exam was performed according to the departmental dose-optimization program which includes automated exposure control, adjustment of the mA and/or kV according to patient size and/or use of iterative reconstruction technique. COMPARISON:  None Available. FINDINGS: Brain: No intracranial hemorrhage, mass effect, or midline shift. No hydrocephalus. The basilar cisterns are patent. No evidence of territorial infarct or acute ischemia. No extra-axial or intracranial fluid collection. Vascular: No hyperdense vessel or unexpected calcification. Skull: Normal. Negative for fracture or focal lesion. Sinuses/Orbits: Paranasal sinuses and mastoid air cells are clear. The visualized orbits are unremarkable. Other: None. IMPRESSION: Normal head CT. Electronically Signed   By: Narda Rutherford M.D.   On: 11/22/2023 22:50    Procedures Procedures    Medications Ordered in ED Medications - No data to display  ED Course/ Medical Decision Making/ A&P   {  Medical Decision Making  This patient is a 53 year old female who presents to the ED for concern of right-sided headache and hypertension x 1 day.  Headache is  noted to be intermittent and described as sharp and lasting only a few seconds to the right side of her face over her forehead, cheek, mandible.  Note that this is not activity related and not related to morning or evening.  Currently asymptomatic at this time.  Also exam, patient is noted to have some mild lower abdominal tenderness to palpation which she says is chronic.  Exam is otherwise unremarkable with a neuroexam unremarkable.   With symptoms being acute and with worsening headache and reported neurological deficits during this period, CT of the head was done to evaluate for any gross intracranial abnormalities.  CBC and BMP were also done to evaluate for any  underlying infection that could be causing and UA was also done to evaluate for suprapubic tenderness with headache.  On reevaluation, patient is still asymptomatic and labs and CT imaging have all been benign being unremarkable.  Patient had not been able provide a urine sample but does not feel that she needs to right now as she feels much better knowing that her lab work and CT imaging is better.  Vital signs remained stable to the course of her time here.  And patient has had stable blood pressure as well.  Recommend she follow-up with her PCP within the next week or so to have further evaluation for this intermittent headache and provided strict turn to ED precautions.  Patient's first agreement and understanding of plan.  All questions were answered.  I believe patient safe discharge at this time.    Differential diagnoses prior to evaluation: The emergent differential diagnosis includes, but is not limited to, CVA, migraine, tension headache, cluster headache, intracranial bleed, trigeminal neuralgia, venous thrombus, tumor, meningitis,. This is not an exhaustive differential.   Past Medical History / Co-morbidities / Social History: Hypertension and diabetes  Additional history: Chart reviewed. Pertinent results include: Patient  had noted to be seeing primary care both yesterday and today however unable to see these notes on Care Everywhere or in chart.  Patient was noted to have seen podiatry on 11/18/2023.  Lab Tests/Imaging studies: I personally interpreted labs/imaging and the pertinent results include: CBC unremarkable BMP unremarkable CT head unremarkable. I agree with the radiologist interpretation.   Medications: No medications necessary at this time.  I have reviewed the patients home medicines and have made adjustments as needed.  Disposition: After consideration of the diagnostic results and the patients response to treatment, I feel that the patient would benefit from discharge but as above.   emergency department workup does not suggest an emergent condition requiring admission or immediate intervention beyond what has been performed at this time. The plan is: Follow-up with PCP, return for new or worsening symptoms. The patient is safe for discharge and has been instructed to return immediately for worsening symptoms, change in symptoms or any other concerns.   Final Clinical Impression(s) / ED Diagnoses Final diagnoses:  Hypertension, unspecified type  Acute nonintractable headache, unspecified headache type    Rx / DC Orders ED Discharge Orders     None         Lunette Stands, PA-C 11/22/23 2350    Rolan Bucco, MD 11/23/23 1202

## 2023-11-22 NOTE — Discharge Instructions (Signed)
 You were seen today for headache and hyper pressure.  Your labs and imaging and physical exam are all very reassuring that I have low suspicion for any emergent process present at this time.  Recommend he continue take your BP medications as prescribed and follow-up with PCP this week for further management.  Continue to take ibuprofen and Tylenol for relief.  Take Tylenol (acetominophen)  650mg  every 4-6 hours, as needed for pain or fever. Do not take more than 4,000 mg in a 24-hour period. As this may cause liver damage. While this is rare, if you begin to develop yellowing of the skin or eyes, stop taking and return to ER immediately.  Take Ibuprofen 400mg  every 4-6 hours for pain or fever, not exceeding 3,200 mg per day as more than 3,200mg  can cause Stomach irritation, dizziness, kidney issues with long-term use.  Return for any new or worsening symptoms including one-sided weakness, confusion, headache with fever, headache with vision changes, chest pain, shortness of breath, lower leg swelling.
